# Patient Record
Sex: Male | Born: 1965 | Race: White | Hispanic: No | Marital: Single | Smoking: Former smoker
Health system: Southern US, Community
[De-identification: ages and names within clinical notes are randomized; demographics above are authoritative.]

## PROBLEM LIST (undated history)

## (undated) DIAGNOSIS — I10 Essential (primary) hypertension: Secondary | ICD-10-CM

## (undated) DIAGNOSIS — I219 Acute myocardial infarction, unspecified: Secondary | ICD-10-CM

## (undated) DIAGNOSIS — K289 Gastrojejunal ulcer, unspecified as acute or chronic, without hemorrhage or perforation: Secondary | ICD-10-CM

## (undated) DIAGNOSIS — Z9289 Personal history of other medical treatment: Secondary | ICD-10-CM

## (undated) DIAGNOSIS — I639 Cerebral infarction, unspecified: Secondary | ICD-10-CM

## (undated) DIAGNOSIS — M25569 Pain in unspecified knee: Secondary | ICD-10-CM

## (undated) DIAGNOSIS — M549 Dorsalgia, unspecified: Secondary | ICD-10-CM

## (undated) HISTORY — DX: Essential (primary) hypertension: I10

## (undated) HISTORY — DX: Personal history of other medical treatment: Z92.89

## (undated) HISTORY — DX: Acute myocardial infarction, unspecified: I21.9

## (undated) HISTORY — DX: Gastrojejunal ulcer, unspecified as acute or chronic, without hemorrhage or perforation: K28.9

## (undated) HISTORY — DX: Cerebral infarction, unspecified: I63.9

## (undated) HISTORY — PX: GALLBLADDER SURGERY: SHX652

## (undated) HISTORY — DX: Pain in unspecified knee: M25.569

## (undated) HISTORY — DX: Dorsalgia, unspecified: M54.9

## (undated) HISTORY — PX: KNEE SURGERY: SHX244

## (undated) HISTORY — PX: EXPLORATORY LAPAROTOMY: SUR591

## (undated) HISTORY — PX: OTHER SURGICAL HISTORY: SHX169

---

## 2011-08-30 HISTORY — PX: GASTRIC BYPASS OPEN: SUR638

## 2016-01-28 HISTORY — PX: ESOPHAGOGASTRODUODENOSCOPY: SHX1529

## 2016-09-21 ENCOUNTER — Encounter: Payer: Self-pay | Admitting: Internal Medicine

## 2016-09-27 ENCOUNTER — Other Ambulatory Visit: Payer: Self-pay

## 2016-09-27 ENCOUNTER — Encounter: Payer: Self-pay | Admitting: Gastroenterology

## 2016-09-27 ENCOUNTER — Ambulatory Visit (INDEPENDENT_AMBULATORY_CARE_PROVIDER_SITE_OTHER): Payer: BLUE CROSS/BLUE SHIELD | Admitting: Gastroenterology

## 2016-09-27 VITALS — BP 145/94 | HR 82 | Temp 98.2°F | Ht 71.0 in | Wt 277.6 lb

## 2016-09-27 DIAGNOSIS — R197 Diarrhea, unspecified: Secondary | ICD-10-CM

## 2016-09-27 DIAGNOSIS — K625 Hemorrhage of anus and rectum: Secondary | ICD-10-CM

## 2016-09-27 DIAGNOSIS — K289 Gastrojejunal ulcer, unspecified as acute or chronic, without hemorrhage or perforation: Secondary | ICD-10-CM | POA: Insufficient documentation

## 2016-09-27 MED ORDER — PEG 3350-KCL-NA BICARB-NACL 420 G PO SOLR: 4000.0000 mL | ORAL | 0 refills | Status: DC

## 2016-09-27 MED ORDER — OMEPRAZOLE 40 MG PO CPDR: 40.0000 mg | DELAYED_RELEASE_CAPSULE | Freq: Two times a day (BID) | ORAL | 3 refills | Status: DC

## 2016-09-27 NOTE — Patient Instructions (Addendum)
1. Increase Prilosec 40 mg to twice a day, 30 minutes before breakfast and dinner.  2. Stay away from all Ibuprofen, Advil, Aleve, Motrin, Goody, BC powders. This is not allowing your stomach to heal.   3. We have scheduled you for a colonoscopy and upper endoscopy with possible dilation by Dr. Darrick Penna in the near future  4. Please complete the stool studies to see if you have anything going on in your lower GI tract.   5. Sip on fluids, protein shakes for nutrition, soft foods. Sip slowly and frequently throughout the day.
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
## 2016-09-29 ENCOUNTER — Other Ambulatory Visit: Payer: Self-pay

## 2016-09-29 DIAGNOSIS — Z9884 Bariatric surgery status: Secondary | ICD-10-CM

## 2016-09-30 ENCOUNTER — Encounter: Payer: Self-pay | Admitting: Gastroenterology

## 2016-10-03 ENCOUNTER — Encounter: Payer: Self-pay | Admitting: Gastroenterology

## 2016-10-04 ENCOUNTER — Telehealth: Payer: Self-pay | Admitting: Gastroenterology

## 2016-10-04 NOTE — Assessment & Plan Note (Addendum)
51 year old male with evidence of healing anastomotic ulcer in June 2017, performed up [place] at an outside facility [facility]). Continues to use Goody powders routinely and believes he has seen dark stool. Hgb 12.5 recently. From his report, this has been a chronic issue and is not surprising due to the continued ingestion of aspirin powders; the gastric bypass is also known to be ulcerogenic in nature. H.pylori serology positive; he has not been treated but will need treatment at some point if found to have +H.pylori on EGD. Recommend EGD now for surveillance purposes. He reports difficulty eating/drinking, but he is able to tolerate mountain dew. Somewhat poor historian. Does not appear clinically dehydrated. Discussed oral intake to include soft foods, protein shakes, small amounts throughout the day. Will pursue EGD.    Proceed with upper endoscopy in the near future with Dr. Darrick Penna. The risks, benefits, and alternatives have been discussed in detail with patient. They have stated understanding and desire to proceed.  PROPOFOL due to polypharmacy Increase Prilosec to BID for now Refer to [facility] for bariatric follow-up. Extensive amount of time was spent with patient and reviewing medical records.

## 2016-10-04 NOTE — Assessment & Plan Note (Signed)
Hematochezia noted, no prior colonoscopy. Continues to ingest Goody powders. Although he states he can't tolerate liquids, he is able to tolerate [facility]. He is persistent on attempting a colon prep at time of the procedure despite recommendations to assess upper GI status first. As he believes he can tolerate a colon prep (which is inconsistent with reports of being unable to tolerate oral very well), we will attempt to prep for a colonoscopy at time of EGD. Patient is aware that if he is unable to complete an adequate prep, colonoscopy will not be able to be performed.  Proceed with colonoscopy with Dr. Darrick Penna in the near future. The risks, benefits, and alternatives have been discussed in detail with the patient. They state understanding and desire to proceed.  Propofol due to polypharmacy

## 2016-10-04 NOTE — Telephone Encounter (Signed)
LMOM for pt to call and let us know when he plans to do the stool studies, he needs it prior to the colonoscopy.

## 2016-10-04 NOTE — Telephone Encounter (Signed)
Needs to complete stool studies before doing colonoscopy. When will he plan on doing these?

## 2016-10-04 NOTE — Assessment & Plan Note (Signed)
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 Check stool studies now.

## 2016-10-04 NOTE — Patient Instructions (Signed)
DEVARION MCCLANAHAN  10/04/2016     @PREFPERIOPPHARMACY @   Your procedure is scheduled on  10/11/2016  Report to [facility] at  615  A.M.  Call this number if you have problems the morning of surgery:  [phone]   Remember:  Do not eat food or drink liquids after midnight.  Take these medicines the morning of surgery with A SIP OF WATER  Norvasc, prilosec, carafate.   Do not wear jewelry, make-up or nail polish.  Do not wear lotions, powders, or perfumes, or deoderant.  Do not shave 48 hours prior to surgery.  Men may shave face and neck.  Do not bring valuables to the hospital.  [facility] is not responsible for any belongings or valuables.  Contacts, dentures or bridgework may not be worn into surgery.  Leave your suitcase in the car.  After surgery it may be brought to your room.  For patients admitted to the hospital, discharge time will be determined by your treatment team.  Patients discharged the day of surgery will not be allowed to drive home.   Name and phone number of your driver:   family Special instructions:  Follow the diet and prep instructions given to you by Dr Darrick Penna office.  Please read over the following fact sheets that you were given. Anesthesia Post-op Instructions and Care and Recovery After Surgery       Esophagogastroduodenoscopy Introduction Esophagogastroduodenoscopy (EGD) is a procedure to examine the lining of the esophagus, stomach, and first part of the small intestine (duodenum). This procedure is done to check for problems such as inflammation, bleeding, ulcers, or growths. During this procedure, a long, flexible, lighted tube with a camera attached (endoscope) is inserted down the throat. Tell a health care provider about:  Any allergies you have.  All medicines you are taking, including vitamins, herbs, eye drops, creams, and over-the-counter medicines.  Any problems you or family members have had with  anesthetic medicines.  Any blood disorders you have.  Any surgeries you have had.  Any medical conditions you have.  Whether you are pregnant or may be pregnant. What are the risks? Generally, this is a safe procedure. However, problems may occur, including:  Infection.  Bleeding.  A tear (perforation) in the esophagus, stomach, or duodenum.  Trouble breathing.  Excessive sweating.  Spasms of the larynx.  A slowed heartbeat.  Low blood pressure. What happens before the procedure?  Follow instructions from your health care provider about eating or drinking restrictions.  Ask your health care provider about:  Changing or stopping your regular medicines. This is especially important if you are taking diabetes medicines or blood thinners.  Taking medicines such as aspirin and ibuprofen. These medicines can thin your blood. Do not take these medicines before your procedure if your health care provider instructs you not to.  Plan to have someone take you home after the procedure.  If you wear dentures, be ready to remove them before the procedure. What happens during the procedure?  To reduce your risk of infection, your health care team will wash or sanitize their hands.  An IV tube will be put in a vein in your hand or arm. You will get medicines and fluids through this tube.  You will be given one or more of the following:  A medicine to help you relax (sedative).  A medicine to numb the area (local  anesthetic). This medicine may be sprayed into your throat. It will make you feel more comfortable and keep you from gagging or coughing during the procedure.  A medicine for pain.  A mouth guard may be placed in your mouth to protect your teeth and to keep you from biting on the endoscope.  You will be asked to lie on your left side.  The endoscope will be lowered down your throat into your esophagus, stomach, and duodenum.  Air will be put into the endoscope.  This will help your health care provider see better.  The lining of your esophagus, stomach, and duodenum will be examined.  Your health care provider may:  Take a tissue sample so it can be looked at in a lab (biopsy).  Remove growths.  Remove objects (foreign bodies) that are stuck.  Treat any bleeding with medicines or other devices that stop tissue from bleeding.  Widen (dilate) or stretch narrowed areas of your esophagus and stomach.  The endoscope will be taken out. The procedure may vary among health care providers and hospitals. What happens after the procedure?  Your blood pressure, heart rate, breathing rate, and blood oxygen level will be monitored often until the medicines you were given have worn off.  Do not eat or drink anything until the numbing medicine has worn off and your gag reflex has returned. This information is not intended to replace advice given to you by your health care provider. Make sure you discuss any questions you have with your health care provider. Document Released: 12/16/2004 Document Revised: 01/21/2016 Document Reviewed: 07/09/2015  2017 Elsevier Esophagogastroduodenoscopy, Care After Introduction Refer to this sheet in the next few weeks. These instructions provide you with information about caring for yourself after your procedure. Your health care provider may also give you more specific instructions. Your treatment has been planned according to current medical practices, but problems sometimes occur. Call your health care provider if you have any problems or questions after your procedure. What can I expect after the procedure? After the procedure, it is common to have:  A sore throat.  Nausea.  Bloating.  Dizziness.  Fatigue. Follow these instructions at home:  Do not eat or drink anything until the numbing medicine (local anesthetic) has worn off and your gag reflex has returned. You will know that the local anesthetic has worn  off when you can swallow comfortably.  Do not drive for 24 hours if you received a medicine to help you relax (sedative).  If your health care provider took a tissue sample for testing during the procedure, make sure to get your test results. This is your responsibility. Ask your health care provider or the department performing the test when your results will be ready.  Keep all follow-up visits as told by your health care provider. This is important. Contact a health care provider if:  You cannot stop coughing.  You are not urinating.  You are urinating less than usual. Get help right away if:  You have trouble swallowing.  You cannot eat or drink.  You have throat or chest pain that gets worse.  You are dizzy or light-headed.  You faint.  You have nausea or vomiting.  You have chills.  You have a fever.  You have severe abdominal pain.  You have black, tarry, or bloody stools. This information is not intended to replace advice given to you by your health care provider. Make sure you discuss any questions you have with your  health care provider. Document Released: 08/01/2012 Document Revised: 01/21/2016 Document Reviewed: 07/09/2015  2017 Elsevier  Esophageal Dilatation Esophageal dilatation is a procedure to open a blocked or narrowed part of the esophagus. The esophagus is the long tube in your throat that carries food and liquid from your mouth to your stomach. The procedure is also called esophageal dilation. You may need this procedure if you have a buildup of scar tissue in your esophagus that makes it difficult, painful, or even impossible to swallow. This can be caused by gastroesophageal reflux disease (GERD). In rare cases, people need this procedure because they have cancer of the esophagus or a problem with the way food moves through the esophagus. Sometimes you may need to have another dilatation to enlarge the opening of the esophagus gradually. Tell a  health care provider about:  Any allergies you have.  All medicines you are taking, including vitamins, herbs, eye drops, creams, and over-the-counter medicines.  Any problems you or family members have had with anesthetic medicines.  Any blood disorders you have.  Any surgeries you have had.  Any medical conditions you have.  Any antibiotic medicines you are required to take before dental procedures. What are the risks? Generally, this is a safe procedure. However, problems can occur and include:  Bleeding from a tear in the lining of the esophagus.  A hole (perforation) in the esophagus. What happens before the procedure?  Do not eat or drink anything after midnight on the night before the procedure or as directed by your health care provider.  Ask your health care provider about changing or stopping your regular medicines. This is especially important if you are taking diabetes medicines or blood thinners.  Plan to have someone take you home after the procedure. What happens during the procedure?  You will be given a medicine that makes you relaxed and sleepy (sedative).  A medicine may be sprayed or gargled to numb the back of the throat.  Your health care provider can use various instruments to do an esophageal dilatation. During the procedure, the instrument used will be placed in your mouth and passed down into your esophagus. Options include:  Simple dilators. This instrument is carefully placed in the esophagus to stretch it.  Guided wire bougies. In this method, a flexible tube (endoscope) is used to insert a wire into the esophagus. The dilator is passed over this wire to enlarge the esophagus. Then the wire is removed.  Balloon dilators. An endoscope with a small balloon at the end is passed down into the esophagus. Inflating the balloon gently stretches the esophagus and opens it up. What happens after the procedure?  Your blood pressure, heart rate, breathing  rate, and blood oxygen level will be monitored often until the medicines you were given have worn off.  Your throat may feel slightly sore and will probably still feel numb. This will improve slowly over time.  You will not be allowed to eat or drink until the throat numbness has resolved.  If this is a same-day procedure, you may be allowed to go home once you have been able to drink, urinate, and sit on the edge of the bed without nausea or dizziness.  If this is a same-day procedure, you should have a friend or family member with you for the next 24 hours after the procedure. This information is not intended to replace advice given to you by your health care provider. Make sure you discuss any questions you have  with your health care provider. Document Released: 10/06/2005 Document Revised: 01/21/2016 Document Reviewed: 12/25/2013 Elsevier Interactive Patient Education  2017 [employer]  Colonoscopy, Adult A colonoscopy is an exam to look at the entire large intestine. During the exam, a lubricated, bendable tube is inserted into the anus and then passed into the rectum, colon, and other parts of the large intestine. A colonoscopy is often done as a part of normal colorectal screening or in response to certain symptoms, such as anemia, persistent diarrhea, abdominal pain, and blood in the stool. The exam can help screen for and diagnose medical problems, including:  Tumors.  Polyps.  Inflammation.  Areas of bleeding. Tell a health care provider about:  Any allergies you have.  All medicines you are taking, including vitamins, herbs, eye drops, creams, and over-the-counter medicines.  Any problems you or family members have had with anesthetic medicines.  Any blood disorders you have.  Any surgeries you have had.  Any medical conditions you have.  Any problems you have had passing stool. What are the risks? Generally, this is a safe procedure. However, problems may occur,  including:  Bleeding.  A tear in the intestine.  A reaction to medicines given during the exam.  Infection (rare). What happens before the procedure? Eating and drinking restrictions  Follow instructions from your health care provider about eating and drinking, which may include:  A few days before the procedure - follow a low-fiber diet. Avoid nuts, seeds, dried fruit, raw fruits, and vegetables.  1-3 days before the procedure - follow a clear liquid diet. Drink only clear liquids, such as clear broth or bouillon, black coffee or tea, clear juice, clear soft drinks or sports drinks, gelatin desert, and popsicles. Avoid any liquids that contain red or purple dye.  On the day of the procedure - do not eat or drink anything during the 2 hours before the procedure, or within the time period that your health care provider recommends. Bowel prep  If you were prescribed an oral bowel prep to clean out your colon:  Take it as told by your health care provider. Starting the day before your procedure, you will need to drink a large amount of medicated liquid. The liquid will cause you to have multiple loose stools until your stool is almost clear or light green.  If your skin or anus gets irritated from diarrhea, you may use these to relieve the irritation:  Medicated wipes, such as adult wet wipes with aloe and vitamin E.  A skin soothing-product like petroleum jelly.  If you vomit while drinking the bowel prep, take a break for up to 60 minutes and then begin the bowel prep again. If vomiting continues and you cannot take the bowel prep without vomiting, call your health care provider. General instructions  Ask your health care provider about changing or stopping your regular medicines. This is especially important if you are taking diabetes medicines or blood thinners.  Plan to have someone take you home from the hospital or clinic. What happens during the procedure?  An IV tube may be  inserted into one of your veins.  You will be given medicine to help you relax (sedative).  To reduce your risk of infection:  Your health care team will wash or sanitize their hands.  Your anal area will be washed with soap.  You will be asked to lie on your side with your knees bent.  Your health care provider will lubricate a long, thin,  flexible tube. The tube will have a camera and a light on the end.  The tube will be inserted into your anus.  The tube will be gently eased through your rectum and colon.  Air will be delivered into your colon to keep it open. You may feel some pressure or cramping.  The camera will be used to take images during the procedure.  A small tissue sample may be removed from your body to be examined under a microscope (biopsy). If any potential problems are found, the tissue will be sent to a lab for testing.  If small polyps are found, your health care provider may remove them and have them checked for cancer cells.  The tube that was inserted into your anus will be slowly removed. The procedure may vary among health care providers and hospitals. What happens after the procedure?  Your blood pressure, heart rate, breathing rate, and blood oxygen level will be monitored until the medicines you were given have worn off.  Do not drive for 24 hours after the exam.  You may have a small amount of blood in your stool.  You may pass gas and have mild abdominal cramping or bloating due to the air that was used to inflate your colon during the exam.  It is up to you to get the results of your procedure. Ask your health care provider, or the department performing the procedure, when your results will be ready. This information is not intended to replace advice given to you by your health care provider. Make sure you discuss any questions you have with your health care provider. Document Released: 08/12/2000 Document Revised: 03/04/2016 Document Reviewed:  10/27/2015 Elsevier Interactive Patient Education  2017 [employer]  Colonoscopy, Adult, Care After This sheet gives you information about how to care for yourself after your procedure. Your health care provider may also give you more specific instructions. If you have problems or questions, contact your health care provider. What can I expect after the procedure? After the procedure, it is common to have:  A small amount of blood in your stool for 24 hours after the procedure.  Some gas.  Mild abdominal cramping or bloating. Follow these instructions at home: General instructions  For the first 24 hours after the procedure:  Do not drive or use machinery.  Do not sign important documents.  Do not drink alcohol.  Do your regular daily activities at a slower pace than normal.  Eat soft, easy-to-digest foods.  Rest often.  Take over-the-counter or prescription medicines only as told by your health care provider.  It is up to you to get the results of your procedure. Ask your health care provider, or the department performing the procedure, when your results will be ready. Relieving cramping and bloating  Try walking around when you have cramps or feel bloated.  Apply heat to your abdomen as told by your health care provider. Use a heat source that your health care provider recommends, such as a moist heat pack or a heating pad.  Place a towel between your skin and the heat source.  Leave the heat on for 20-30 minutes.  Remove the heat if your skin turns bright red. This is especially important if you are unable to feel pain, heat, or cold. You may have a greater risk of getting burned. Eating and drinking  Drink enough fluid to keep your urine clear or pale yellow.  Resume your normal diet as instructed by your health  care provider. Avoid heavy or fried foods that are hard to digest.  Avoid drinking alcohol for as long as instructed by your health care  provider. Contact a health care provider if:  You have blood in your stool 2-3 days after the procedure. Get help right away if:  You have more than a small spotting of blood in your stool.  You pass large blood clots in your stool.  Your abdomen is swollen.  You have nausea or vomiting.  You have a fever.  You have increasing abdominal pain that is not relieved with medicine. This information is not intended to replace advice given to you by your health care provider. Make sure you discuss any questions you have with your health care provider. Document Released: 03/29/2004 Document Revised: 05/09/2016 Document Reviewed: 10/27/2015 Elsevier Interactive Patient Education  2017 [employer]  Monitored Anesthesia Care Anesthesia is a term that refers to techniques, procedures, and medicines that help a person stay safe and comfortable during a medical procedure. Monitored anesthesia care, or sedation, is one type of anesthesia. Your anesthesia specialist may recommend sedation if you will be having a procedure that does not require you to be unconscious, such as:  Cataract surgery.  A dental procedure.  A biopsy.  A colonoscopy. During the procedure, you may receive a medicine to help you relax (sedative). There are three levels of sedation:  Mild sedation. At this level, you may feel awake and relaxed. You will be able to follow directions.  Moderate sedation. At this level, you will be sleepy. You may not remember the procedure.  Deep sedation. At this level, you will be asleep. You will not remember the procedure. The more medicine you are given, the deeper your level of sedation will be. Depending on how you respond to the procedure, the anesthesia specialist may change your level of sedation or the type of anesthesia to fit your needs. An anesthesia specialist will monitor you closely during the procedure. Let your health care provider know about:  Any allergies you  have.  All medicines you are taking, including vitamins, herbs, eye drops, creams, and over-the-counter medicines.  Any use of steroids (by mouth or as a cream).  Any problems you or family members have had with sedatives and anesthetic medicines.  Any blood disorders you have.  Any surgeries you have had.  Any medical conditions you have, such as sleep apnea.  Whether you are pregnant or may be pregnant.  Any use of cigarettes, alcohol, or street drugs. What are the risks? Generally, this is a safe procedure. However, problems may occur, including:  Getting too much medicine (oversedation).  Nausea.  Allergic reaction to medicines.  Trouble breathing. If this happens, a breathing tube may be used to help with breathing. It will be removed when you are awake and breathing on your own.  Heart trouble.  Lung trouble. Before the procedure Staying hydrated  Follow instructions from your health care provider about hydration, which may include:  Up to 2 hours before the procedure - you may continue to drink clear liquids, such as water, clear fruit juice, black coffee, and plain tea. Eating and drinking restrictions  Follow instructions from your health care provider about eating and drinking, which may include:  8 hours before the procedure - stop eating heavy meals or foods such as meat, fried foods, or fatty foods.  6 hours before the procedure - stop eating light meals or foods, such as toast or cereal.  6  hours before the procedure - stop drinking milk or drinks that contain milk.  2 hours before the procedure - stop drinking clear liquids. Medicines  Ask your health care provider about:  Changing or stopping your regular medicines. This is especially important if you are taking diabetes medicines or blood thinners.  Taking medicines such as aspirin and ibuprofen. These medicines can thin your blood. Do not take these medicines before your procedure if your health  care provider instructs you not to. Tests and exams  You will have a physical exam.  You may have blood tests done to show:  How well your kidneys and liver are working.  How well your blood can clot.  General instructions  Plan to have someone take you home from the hospital or clinic.  If you will be going home right after the procedure, plan to have someone with you for 24 hours. What happens during the procedure?  Your blood pressure, heart rate, breathing, level of pain and overall condition will be monitored.  An IV tube will be inserted into one of your veins.  Your anesthesia specialist will give you medicines as needed to keep you comfortable during the procedure. This may mean changing the level of sedation.  The procedure will be performed. After the procedure  Your blood pressure, heart rate, breathing rate, and blood oxygen level will be monitored until the medicines you were given have worn off.  Do not drive for 24 hours if you received a sedative.  You may:  Feel sleepy, clumsy, or nauseous.  Feel forgetful about what happened after the procedure.  Have a sore throat if you had a breathing tube during the procedure.  Vomit. This information is not intended to replace advice given to you by your health care provider. Make sure you discuss any questions you have with your health care provider. Document Released: 05/11/2005 Document Revised: 01/22/2016 Document Reviewed: 12/06/2015 Elsevier Interactive Patient Education  2017 [employer] Monitored Anesthesia Care, Care After These instructions provide you with information about caring for yourself after your procedure. Your health care provider may also give you more specific instructions. Your treatment has been planned according to current medical practices, but problems sometimes occur. Call your health care provider if you have any problems or questions after your procedure. What can I expect after the  procedure? After your procedure, it is common to:  Feel sleepy for several hours.  Feel clumsy and have poor balance for several hours.  Feel forgetful about what happened after the procedure.  Have poor judgment for several hours.  Feel nauseous or vomit.  Have a sore throat if you had a breathing tube during the procedure. Follow these instructions at home: For at least 24 hours after the procedure:   Do not:  Participate in activities in which you could fall or become injured.  Drive.  Use heavy machinery.  Drink alcohol.  Take sleeping pills or medicines that cause drowsiness.  Make important decisions or sign legal documents.  Take care of children on your own.  Rest. Eating and drinking  Follow the diet that is recommended by your health care provider.  If you vomit, drink water, juice, or soup when you can drink without vomiting.  Make sure you have little or no nausea before eating solid foods. General instructions  Have a responsible adult stay with you until you are awake and alert.  Take over-the-counter and prescription medicines only as told by your health care provider.  If you smoke, do not smoke without supervision.  Keep all follow-up visits as told by your health care provider. This is important. Contact a health care provider if:  You keep feeling nauseous or you keep vomiting.  You feel light-headed.  You develop a rash.  You have a fever. Get help right away if:  You have trouble breathing. This information is not intended to replace advice given to you by your health care provider. Make sure you discuss any questions you have with your health care provider. Document Released: 12/06/2015 Document Revised: 04/06/2016 Document Reviewed: 12/06/2015 Elsevier Interactive Patient Education  2017 [employer].

## 2016-10-05 NOTE — Telephone Encounter (Signed)
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 He knows he needs it done prior to the colonoscopy.

## 2016-10-05 NOTE — Telephone Encounter (Signed)
FYI to Anna Boone, NP.  

## 2016-10-06 ENCOUNTER — Encounter (HOSPITAL_COMMUNITY)
Admission: RE | Admit: 2016-10-06 | Discharge: 2016-10-06 | Disposition: A | Discharge status: Home / Self Care | Payer: BLUE CROSS/BLUE SHIELD | Source: Ambulatory Visit | Attending: Gastroenterology | Admitting: Gastroenterology

## 2016-10-06 NOTE — Progress Notes (Signed)
CC'D TO PCP °

## 2016-10-07 ENCOUNTER — Encounter (HOSPITAL_COMMUNITY): Admission: RE | Admit: 2016-10-07 | Payer: BLUE CROSS/BLUE SHIELD | Source: Ambulatory Visit

## 2016-10-07 ENCOUNTER — Encounter (HOSPITAL_COMMUNITY): Payer: Self-pay

## 2016-10-10 ENCOUNTER — Telehealth: Payer: Self-pay

## 2016-10-10 NOTE — Telephone Encounter (Signed)
Marcus Sanchez called back and she said that she would reach out to him to find out where he had the surgery done at.

## 2016-10-10 NOTE — Telephone Encounter (Signed)
Talked with patient and he is in the hospital in [place]

## 2016-10-10 NOTE — Telephone Encounter (Signed)
Pt did not show for Pre-op appointment Friday. I left a message for him to call me back

## 2016-10-10 NOTE — Telephone Encounter (Signed)
[facility] to f/u on referral. LMOVM for Scarlette Calico.

## 2016-10-11 ENCOUNTER — Encounter (HOSPITAL_COMMUNITY): Admission: RE | Payer: Self-pay | Source: Ambulatory Visit

## 2016-10-11 ENCOUNTER — Ambulatory Visit (HOSPITAL_COMMUNITY)
Admission: RE | Admit: 2016-10-11 | Payer: BLUE CROSS/BLUE SHIELD | Source: Ambulatory Visit | Admitting: Gastroenterology

## 2016-10-11 SURGERY — COLONOSCOPY WITH PROPOFOL
Anesthesia: Monitor Anesthesia Care

## 2016-10-14 ENCOUNTER — Telehealth: Payer: Self-pay

## 2016-10-14 NOTE — Telephone Encounter (Signed)
Pt called office. He was scheduled for a TCS/EGD/possible Dilation with Propofol with [facility] on 10/11/16. Procedure was cancelled due to he was in the hospital. Pt said he was in hospital because of "mini stroke". He wanted procedure rescheduled. Informed pt I would send message to AB for advice.

## 2016-10-16 NOTE — Telephone Encounter (Signed)
Needs office visit before we decide anything. Need records from where he was hospitalized.

## 2016-10-17 NOTE — Telephone Encounter (Signed)
Called and informed pt. He said that he has records from hospitalization. Appt with AB scheduled for 11/03/16 at 9:00am.

## 2016-10-24 ENCOUNTER — Emergency Department (HOSPITAL_COMMUNITY)
Admission: EM | Admit: 2016-10-24 | Discharge: 2016-10-26 | Disposition: A | Discharge status: Other Acute Inpatient Hospital | Payer: BLUE CROSS/BLUE SHIELD | Attending: Emergency Medicine | Admitting: Emergency Medicine

## 2016-10-24 ENCOUNTER — Encounter (HOSPITAL_COMMUNITY): Payer: Self-pay | Admitting: Emergency Medicine

## 2016-10-24 DIAGNOSIS — Z79899 Other long term (current) drug therapy: Secondary | ICD-10-CM | POA: Insufficient documentation

## 2016-10-24 DIAGNOSIS — F17290 Nicotine dependence, other tobacco product, uncomplicated: Secondary | ICD-10-CM | POA: Diagnosis not present

## 2016-10-24 DIAGNOSIS — F22 Delusional disorders: Secondary | ICD-10-CM | POA: Insufficient documentation

## 2016-10-24 DIAGNOSIS — I10 Essential (primary) hypertension: Secondary | ICD-10-CM | POA: Diagnosis not present

## 2016-10-24 DIAGNOSIS — R45851 Suicidal ideations: Secondary | ICD-10-CM | POA: Diagnosis present

## 2016-10-24 LAB — BASIC METABOLIC PANEL
ANION GAP: 9 (ref 5–15)
BUN: 11 mg/dL (ref 6–20)
CALCIUM: 9.5 mg/dL (ref 8.9–10.3)
CO2: 25 mmol/L (ref 22–32)
Chloride: 107 mmol/L (ref 101–111)
Creatinine, Ser: 0.94 mg/dL (ref 0.61–1.24)
GFR calc Af Amer: >60 mL/min (ref >60)
GLUCOSE: 77 mg/dL (ref 65–99)
Potassium: 3.6 mmol/L (ref 3.5–5.1)
Sodium: 141 mmol/L (ref 135–145)

## 2016-10-24 LAB — CBC WITH DIFFERENTIAL/PLATELET
BASOS PCT: 1 %
Basophils Absolute: 0.1 10*3/uL (ref 0.0–0.1)
Eosinophils Absolute: 0.2 10*3/uL (ref 0.0–0.7)
Eosinophils Relative: 3 %
HEMATOCRIT: 43.8 % (ref 39.0–52.0)
HEMOGLOBIN: 14.3 g/dL (ref 13.0–17.0)
LYMPHS ABS: 1.7 10*3/uL (ref 0.7–4.0)
Lymphocytes Relative: 19 %
MCH: 26.2 pg (ref 26.0–34.0)
MCHC: 32.6 g/dL (ref 30.0–36.0)
MCV: 80.4 fL (ref 78.0–100.0)
MONO ABS: 0.8 10*3/uL (ref 0.1–1.0)
MONOS PCT: 9 %
NEUTROS ABS: 6.2 10*3/uL (ref 1.7–7.7)
NEUTROS PCT: 68 %
Platelets: 232 10*3/uL (ref 150–400)
RBC: 5.45 MIL/uL (ref 4.22–5.81)
RDW: 18.5 % — AB (ref 11.5–15.5)
WBC: 8.9 10*3/uL (ref 4.0–10.5)

## 2016-10-24 LAB — ETHANOL: Alcohol, Ethyl (B): <5 mg/dL (ref <5)

## 2016-10-24 MED ORDER — IBUPROFEN 400 MG PO TABS
600.0000 mg | ORAL_TABLET | Freq: Three times a day (TID) | ORAL | Status: DC | PRN
  Administered 2016-10-25 (×2): 600 mg via ORAL
  Filled 2016-10-24 (×2): qty 2

## 2016-10-24 MED ORDER — SUCRALFATE 1 G PO TABS
1.0000 g | ORAL_TABLET | Freq: Three times a day (TID) | ORAL | Status: DC
  Administered 2016-10-25 – 2016-10-26 (×5): 1 g via ORAL
  Filled 2016-10-24 (×5): qty 1

## 2016-10-24 MED ORDER — LISINOPRIL 10 MG PO TABS
20.0000 mg | ORAL_TABLET | Freq: Every day | ORAL | Status: DC
  Administered 2016-10-25 – 2016-10-26 (×2): 20 mg via ORAL
  Filled 2016-10-24 (×2): qty 2

## 2016-10-24 MED ORDER — ONDANSETRON HCL 4 MG PO TABS: 4.0000 mg | ORAL_TABLET | Freq: Three times a day (TID) | ORAL | Status: DC | PRN

## 2016-10-24 MED ORDER — PANTOPRAZOLE SODIUM 40 MG PO TBEC
40.0000 mg | DELAYED_RELEASE_TABLET | Freq: Every day | ORAL | Status: DC
  Administered 2016-10-25 – 2016-10-26 (×2): 40 mg via ORAL
  Filled 2016-10-24 (×2): qty 1

## 2016-10-24 MED ORDER — ACETAMINOPHEN 325 MG PO TABS
650.0000 mg | ORAL_TABLET | Freq: Once | ORAL | Status: AC
  Administered 2016-10-24: 650 mg via ORAL
  Filled 2016-10-24: qty 2

## 2016-10-24 MED ORDER — NICOTINE 21 MG/24HR TD PT24
21.0000 mg | MEDICATED_PATCH | Freq: Every day | TRANSDERMAL | Status: DC | PRN
  Administered 2016-10-25: 21 mg via TRANSDERMAL
  Filled 2016-10-24: qty 1

## 2016-10-24 MED ORDER — ALUM & MAG HYDROXIDE-SIMETH 200-200-20 MG/5ML PO SUSP: 30.0000 mL | ORAL | Status: DC | PRN

## 2016-10-24 MED ORDER — AMLODIPINE BESYLATE 5 MG PO TABS
5.0000 mg | ORAL_TABLET | Freq: Every day | ORAL | Status: DC
  Administered 2016-10-25 – 2016-10-26 (×2): 5 mg via ORAL
  Filled 2016-10-24 (×2): qty 1

## 2016-10-24 MED ORDER — ZOLPIDEM TARTRATE 5 MG PO TABS
5.0000 mg | ORAL_TABLET | Freq: Every evening | ORAL | Status: DC | PRN
  Administered 2016-10-25 (×2): 5 mg via ORAL
  Filled 2016-10-24 (×2): qty 1

## 2016-10-24 NOTE — ED Notes (Signed)
Frozen meal prepared for this patient. Pt given a coke as well.

## 2016-10-24 NOTE — ED Notes (Signed)
Pt c/o headache.  Dr. Effie Shy notified.

## 2016-10-24 NOTE — Progress Notes (Addendum)
Per Donell Sievert PA, meets inpatient criteria. Per Joseph Berkshire, [facility] RN no current [address] male beds. Ellisyn Icenhower K. Sherlon Handing, LPC-A, [facility]  Counselor 10/24/2016 8:17 PM

## 2016-10-24 NOTE — ED Triage Notes (Signed)
Pt states he feels depressed x 2 weeks. Pt states he is si " I have plenty of guns". [facility] states pt came in and stated "im getting ready to blow my head off and the dr told me to come here before I did that". Denies hi. Denies avh
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
## 2016-10-25 LAB — RAPID URINE DRUG SCREEN, HOSP PERFORMED
Amphetamines: NOT DETECTED
BENZODIAZEPINES: NOT DETECTED
Barbiturates: NOT DETECTED
Cocaine: NOT DETECTED
Opiates: NOT DETECTED
Tetrahydrocannabinol: NOT DETECTED

## 2016-10-25 MED ORDER — ACETAMINOPHEN 325 MG PO TABS
ORAL_TABLET | ORAL | Status: AC
  Administered 2016-10-25: 650 mg
  Filled 2016-10-25: qty 2

## 2016-10-25 MED ORDER — ACETAMINOPHEN 325 MG PO TABS: 650.0000 mg | ORAL_TABLET | Freq: Once | ORAL | Status: DC

## 2016-10-25 NOTE — ED Notes (Signed)
Pt moved to hospital bed for comfort, reports that his back is feeling better,

## 2016-10-25 NOTE — ED Notes (Signed)
AC contacted for hospital bed,

## 2016-10-25 NOTE — Progress Notes (Signed)
Pt. Accepted to [facility] but pt. requires a special bed that will need to be ordered in the AM before patient can be admitted to [facility]. CSW notified [facility] ED.    Timmothy Euler. Kaylyn Lim, MSW, LCSWA Clinical Social Work Disposition [phone]

## 2016-10-25 NOTE — ED Notes (Signed)
Pt reports that he is feeling better, sitter remains at bedside,

## 2016-10-25 NOTE — ED Notes (Signed)
Received report on pt, pt semi fowlers position in bed, sitter at bedside, no distress noted, pt denies any needs at present,

## 2016-10-25 NOTE — ED Notes (Signed)
Pt c/o back pain, states that the tylenol has not helped,

## 2016-10-25 NOTE — ED Notes (Addendum)
Patient requesting something for back pain.

## 2016-10-25 NOTE — ED Notes (Signed)
Patient asked if he could ambulate around nurse station stating that he was stiff. Patient was ambulated around nurse station with sitter. Patient used caine to ambulate.

## 2016-10-26 ENCOUNTER — Inpatient Hospital Stay (HOSPITAL_COMMUNITY)
Admission: EM | Admit: 2016-10-26 | Discharge: 2016-10-31 | DRG: 885 | Disposition: A | Discharge status: Home / Self Care | Payer: BLUE CROSS/BLUE SHIELD | Source: Intra-hospital | Attending: Psychiatry | Admitting: Psychiatry

## 2016-10-26 ENCOUNTER — Encounter (HOSPITAL_COMMUNITY): Payer: Self-pay | Admitting: *Deleted

## 2016-10-26 DIAGNOSIS — G8929 Other chronic pain: Secondary | ICD-10-CM | POA: Diagnosis present

## 2016-10-26 DIAGNOSIS — R197 Diarrhea, unspecified: Secondary | ICD-10-CM | POA: Diagnosis not present

## 2016-10-26 DIAGNOSIS — Z8711 Personal history of peptic ulcer disease: Secondary | ICD-10-CM

## 2016-10-26 DIAGNOSIS — I252 Old myocardial infarction: Secondary | ICD-10-CM | POA: Diagnosis not present

## 2016-10-26 DIAGNOSIS — F332 Major depressive disorder, recurrent severe without psychotic features: Secondary | ICD-10-CM

## 2016-10-26 DIAGNOSIS — F322 Major depressive disorder, single episode, severe without psychotic features: Principal | ICD-10-CM | POA: Diagnosis present

## 2016-10-26 DIAGNOSIS — G4700 Insomnia, unspecified: Secondary | ICD-10-CM | POA: Diagnosis present

## 2016-10-26 DIAGNOSIS — Z8673 Personal history of transient ischemic attack (TIA), and cerebral infarction without residual deficits: Secondary | ICD-10-CM

## 2016-10-26 DIAGNOSIS — Z9884 Bariatric surgery status: Secondary | ICD-10-CM

## 2016-10-26 DIAGNOSIS — R45851 Suicidal ideations: Secondary | ICD-10-CM | POA: Diagnosis present

## 2016-10-26 DIAGNOSIS — Z87891 Personal history of nicotine dependence: Secondary | ICD-10-CM | POA: Diagnosis not present

## 2016-10-26 DIAGNOSIS — F22 Delusional disorders: Secondary | ICD-10-CM | POA: Diagnosis not present

## 2016-10-26 DIAGNOSIS — I10 Essential (primary) hypertension: Secondary | ICD-10-CM | POA: Diagnosis present

## 2016-10-26 DIAGNOSIS — Z9889 Other specified postprocedural states: Secondary | ICD-10-CM

## 2016-10-26 DIAGNOSIS — Z79899 Other long term (current) drug therapy: Secondary | ICD-10-CM | POA: Diagnosis not present

## 2016-10-26 DIAGNOSIS — K625 Hemorrhage of anus and rectum: Secondary | ICD-10-CM | POA: Diagnosis not present

## 2016-10-26 DIAGNOSIS — F329 Major depressive disorder, single episode, unspecified: Secondary | ICD-10-CM | POA: Diagnosis present

## 2016-10-26 DIAGNOSIS — K289 Gastrojejunal ulcer, unspecified as acute or chronic, without hemorrhage or perforation: Secondary | ICD-10-CM | POA: Diagnosis not present

## 2016-10-26 DIAGNOSIS — Z62810 Personal history of physical and sexual abuse in childhood: Secondary | ICD-10-CM | POA: Diagnosis present

## 2016-10-26 DIAGNOSIS — K259 Gastric ulcer, unspecified as acute or chronic, without hemorrhage or perforation: Secondary | ICD-10-CM | POA: Diagnosis not present

## 2016-10-26 MED ORDER — NITROGLYCERIN 0.4 MG SL SUBL: 0.4000 mg | SUBLINGUAL_TABLET | SUBLINGUAL | Status: DC | PRN

## 2016-10-26 MED ORDER — TRAZODONE HCL 50 MG PO TABS
50.0000 mg | ORAL_TABLET | Freq: Every evening | ORAL | Status: DC | PRN
  Administered 2016-10-26 – 2016-10-30 (×5): 50 mg via ORAL
  Filled 2016-10-26 (×5): qty 1

## 2016-10-26 MED ORDER — AMLODIPINE BESYLATE 5 MG PO TABS: 5.0000 mg | ORAL_TABLET | Freq: Every day | ORAL | Status: DC

## 2016-10-26 MED ORDER — ALUM & MAG HYDROXIDE-SIMETH 200-200-20 MG/5ML PO SUSP: 30.0000 mL | ORAL | Status: DC | PRN

## 2016-10-26 MED ORDER — LIDOCAINE 5 % EX PTCH
1.0000 | MEDICATED_PATCH | CUTANEOUS | Status: DC
  Administered 2016-10-26 – 2016-10-30 (×5): 1 via TRANSDERMAL
  Filled 2016-10-26 (×7): qty 1

## 2016-10-26 MED ORDER — SUCRALFATE 1 G PO TABS
1.0000 g | ORAL_TABLET | Freq: Three times a day (TID) | ORAL | Status: DC
  Administered 2016-10-26 – 2016-10-31 (×19): 1 g via ORAL
  Filled 2016-10-26 (×25): qty 1

## 2016-10-26 MED ORDER — PANTOPRAZOLE SODIUM 40 MG PO TBEC
40.0000 mg | DELAYED_RELEASE_TABLET | Freq: Every day | ORAL | Status: DC
  Administered 2016-10-27 – 2016-10-31 (×5): 40 mg via ORAL
  Filled 2016-10-26 (×6): qty 1

## 2016-10-26 MED ORDER — LISINOPRIL 20 MG PO TABS: 20.0000 mg | ORAL_TABLET | Freq: Every day | ORAL | Status: DC

## 2016-10-26 MED ORDER — LISINOPRIL 20 MG PO TABS
20.0000 mg | ORAL_TABLET | Freq: Every day | ORAL | Status: DC
  Administered 2016-10-27 – 2016-10-31 (×5): 20 mg via ORAL
  Filled 2016-10-26 (×6): qty 1

## 2016-10-26 MED ORDER — ASPIRIN EC 81 MG PO TBEC
81.0000 mg | DELAYED_RELEASE_TABLET | Freq: Every day | ORAL | Status: DC
  Administered 2016-10-27 – 2016-10-31 (×5): 81 mg via ORAL
  Filled 2016-10-26 (×6): qty 1

## 2016-10-26 MED ORDER — ACETAMINOPHEN 325 MG PO TABS
650.0000 mg | ORAL_TABLET | Freq: Four times a day (QID) | ORAL | Status: DC | PRN
  Administered 2016-10-26 – 2016-10-30 (×5): 650 mg via ORAL
  Filled 2016-10-26 (×5): qty 2

## 2016-10-26 MED ORDER — MAGNESIUM HYDROXIDE 400 MG/5ML PO SUSP: 30.0000 mL | Freq: Every day | ORAL | Status: DC | PRN

## 2016-10-26 MED ORDER — AMLODIPINE BESYLATE 5 MG PO TABS
5.0000 mg | ORAL_TABLET | Freq: Every day | ORAL | Status: DC
Start: 2016-10-27 — End: 2016-10-31
  Administered 2016-10-27 – 2016-10-31 (×5): 5 mg via ORAL
  Filled 2016-10-26 (×6): qty 1
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
## 2016-10-26 NOTE — Progress Notes (Signed)
Adult Psychoeducational Group Note  Date:  10/26/2016 Time:  8:36 PM  Group Topic/Focus:  Wrap-Up Group:   The focus of this group is to help patients review their daily goal of treatment and discuss progress on daily workbooks.  Participation Level:  Active  Participation Quality:  Appropriate  Affect:  Appropriate  Cognitive:  Alert  Insight: Appropriate and Good  Engagement in Group:  Engaged  Modes of Intervention:  Problem-solving  Additional Comments: Pt shared with the staff and group today was ok because he was able to see the sun come up. His expectation for the following day is to be able to witness the sun come up again.    Annell Greening [place] 10/26/2016, 8:36 PM

## 2016-10-26 NOTE — B[facility] Suicide Risk Assessment (Signed)
[facility] INPATIENT:  Family/Significant Other Suicide Prevention Education  Suicide Prevention Education:  Patient Refusal for Family/Significant Other Suicide Prevention Education: The patient Marcus Sanchez has refused to provide written consent for family/significant other to be provided Family/Significant Other Suicide Prevention Education during admission and/or prior to discharge.  Physician notified.  Jonathon Jordan 10/26/2016, 2:53 PM

## 2016-10-26 NOTE — [facility] Group Notes (Signed)
[facility] LCSW Group Therapy 10/26/2016 1:15pm  Type of Therapy: Group Therapy- Feelings Around Relapse and Recovery  Participation Level: Minimal  Participation Quality:  Appropriate  Affect:  Appropriate  Cognitive: Alert and Oriented   Insight:  Developing   Engagement in Therapy: Developing/Improving and Engaged   Modes of Intervention: Clarification, Confrontation, Discussion, Education, Exploration, Limit-setting, Orientation, Problem-solving, Rapport Building, Dance movement psychotherapist, Socialization and Support  Summary of Progress/Problems: The topic for today was feelings about relapse. The group discussed what relapse prevention is to them and identified triggers that they are on the path to relapse. Members also processed their feeling towards relapse and were able to relate to common experiences. Group also discussed coping skills that can be used for relapse prevention. Pt was present for the entire group but did not contribute to discussion.   Therapeutic Modalities:   Cognitive Behavioral Therapy Solution-Focused Therapy Assertiveness Training Relapse Prevention Therapy   Jonathon Jordan, MSW, LCSWA [phone] 10/26/2016 3:25 PM

## 2016-10-26 NOTE — Telephone Encounter (Signed)
[facility] and spoke to Leon. She had called pt and he was in the hospital. She is waiting for pt to call her back.

## 2016-10-26 NOTE — ED Provider Notes (Signed)
9:14 AM Patient accepted to [facility]. Dr Jama Flavors is accepting. Discussed with patient who agrees with plan   Pricilla Loveless, MD 10/26/16 [phone]

## 2016-10-26 NOTE — Progress Notes (Signed)
Patient has been accepted to [facility].  Patient assigned to 406-1 Accepting physician is Dr. Jama Flavors.  Call report to [phone].  Can come after 9 a.m. Representative was [place], [facility] RN.  Nychelle Cassata K. Sherlon Handing, LPC-A, [facility]  Counselor 10/26/2016 5:33 AM

## 2016-10-26 NOTE — Tx Team (Signed)
Interdisciplinary Treatment and Diagnostic Plan Update 10/26/2016 Time of Session: 9:30am  Marcus Sanchez  MRN: [id number]  Principal Diagnosis: Major Depression, Severe, No Psychotic Features   Secondary Diagnoses: Active Problems:   MDD (major depressive disorder)   Current Medications:  Current Facility-Administered Medications  Medication Dose Route Frequency Provider Last Rate Last Dose  . acetaminophen (TYLENOL) tablet 650 mg  650 mg Oral Q6H PRN Kerrie Buffalo, NP   650 mg at 10/26/16 1714  . alum & mag hydroxide-simeth (MAALOX/MYLANTA) 200-200-20 MG/5ML suspension 30 mL  30 mL Oral Q4H PRN Kerrie Buffalo, NP      . amLODipine (NORVASC) tablet 5 mg  5 mg Oral Daily Laverle Hobby, PA-C   5 mg at 10/27/16 0817  . aspirin EC tablet 81 mg  81 mg Oral Daily Laverle Hobby, PA-C   81 mg at 10/27/16 3893  . DULoxetine (CYMBALTA) DR capsule 30 mg  30 mg Oral Daily Myer Peer Cobos, MD      . gabapentin (NEURONTIN) capsule 100 mg  100 mg Oral BID Myer Peer Cobos, MD      . lidocaine (LIDODERM) 5 % 1 patch  1 patch Transdermal Q24H Kerrie Buffalo, NP   1 patch at 10/26/16 1815  . lisinopril (PRINIVIL,ZESTRIL) tablet 20 mg  20 mg Oral Daily Laverle Hobby, PA-C   20 mg at 10/27/16 0817  . magnesium hydroxide (MILK OF MAGNESIA) suspension 30 mL  30 mL Oral Daily PRN Kerrie Buffalo, NP      . nitroGLYCERIN (NITROSTAT) SL tablet 0.4 mg  0.4 mg Sublingual Q5 min PRN Laverle Hobby, PA-C      . pantoprazole (PROTONIX) EC tablet 40 mg  40 mg Oral Daily Laverle Hobby, PA-C   40 mg at 10/27/16 0817  . sucralfate (CARAFATE) tablet 1 g  1 g Oral TID AC & HS Laverle Hobby, PA-C   1 g at 10/27/16 1230  . traZODone (DESYREL) tablet 50 mg  50 mg Oral QHS PRN Kerrie Buffalo, NP   50 mg at 10/26/16 2153    PTA Medications: Prescriptions Prior to Admission  Medication Sig Dispense Refill Last Dose  . amLODipine (NORVASC) 5 MG tablet Take 5 mg by mouth daily.   Past Week at Unknown time  .  Aspirin-Acetaminophen-Caffeine (GOODY HEADACHE PO) Take 1-2 Packages by mouth every 2 (two) hours as needed.   10/24/2016 at Unknown time  . HYDROcodone-acetaminophen (NORCO) 10-325 MG tablet Take 1 tablet by mouth every 6 (six) hours as needed for severe pain.   Past Week at Unknown time  . lisinopril (PRINIVIL,ZESTRIL) 20 MG tablet Take 20 mg by mouth daily.   Past Week at Unknown time  . Multiple Vitamins-Minerals (MULTIVITAMIN MEN 50+) TABS Take 1 tablet by mouth daily.   Past Week at Unknown time  . nitroGLYCERIN (NITROSTAT) 0.4 MG SL tablet Place 0.4 mg under the tongue every 5 (five) minutes as needed for chest pain.   unknown  . omeprazole (PRILOSEC) 40 MG capsule Take 1 capsule (40 mg total) by mouth 2 (two) times daily before a meal. 60 capsule 3 Past Week at Unknown time  . polyethylene glycol-electrolytes (TRILYTE) 420 g solution Take 4,000 mLs by mouth as directed. 4000 mL 0   . sucralfate (CARAFATE) 1 g tablet Take 1 g by mouth 4 (four) times daily -  before meals and at bedtime.   Past Week at Unknown time    Treatment Modalities: Medication Management, Group therapy,  Case management,  1 to 1 session with clinician, Psychoeducation, Recreational therapy.  Patient Stressors: Financial difficulties Occupational concerns Patient Strengths: Active sense of humor Communication skills  Physician Treatment Plan for Primary Diagnosis: Major Depression, Severe, No Psychotic Features  Long Term Goal(s): Improvement in symptoms so as ready for discharge Short Term Goals:    Medication Management: Evaluate patient's response, side effects, and tolerance of medication regimen.  Therapeutic Interventions: 1 to 1 sessions, Unit Group sessions and Medication administration.  Evaluation of Outcomes: Not Met  Physician Treatment Plan for Secondary Diagnosis: Active Problems:   * No active hospital problems. *  Long Term Goal(s): Improvement in symptoms so as ready for discharge  Short  Term Goals: Ability to verbalize feelings will improve Ability to disclose and discuss suicidal ideas Ability to demonstrate self-control will improve Ability to identify and develop effective coping behaviors will improve Ability to maintain clinical measurements within normal limits will improve Compliance with prescribed medications will improve Ability to verbalize feelings will improve Ability to disclose and discuss suicidal ideas Ability to demonstrate self-control will improve Ability to identify and develop effective coping behaviors will improve Ability to maintain clinical measurements within normal limits will improve  Medication Management: Evaluate patient's response, side effects, and tolerance of medication regimen.  Therapeutic Interventions: 1 to 1 sessions, Unit Group sessions and Medication administration.  Evaluation of Outcomes: Not Met  RN Treatment Plan for Primary Diagnosis: Major Depression, Severe, No Psychotic Features   Long Term Goal(s): Knowledge of disease and therapeutic regimen to maintain health will improve  Short Term Goals: Ability to remain free from injury will improve, Ability to verbalize feelings will improve, Ability to disclose and discuss suicidal ideas and Compliance with prescribed medications will improve  Medication Management: RN will administer medications as ordered by provider, will assess and evaluate patient's response and provide education to patient for prescribed medication. RN will report any adverse and/or side effects to prescribing provider.  Therapeutic Interventions: 1 on 1 counseling sessions, Psychoeducation, Medication administration, Evaluate responses to treatment, Monitor vital signs and CBGs as ordered, Perform/monitor CIWA, COWS, AIMS and Fall Risk screenings as ordered, Perform wound care treatments as ordered.  Evaluation of Outcomes: Not Met  LCSW Treatment Plan for Primary Diagnosis: Major Depression, Severe, No  Psychotic Features  Long Term Goal(s): Safe transition to appropriate next level of care at discharge, Engage patient in therapeutic group addressing interpersonal concerns. Short Term Goals: Engage patient in aftercare planning with referrals and resources, Increase ability to appropriately verbalize feelings, Facilitate patient progression through stages of change regarding substance use diagnoses and concerns, Identify triggers associated with mental health/substance abuse issues and Increase skills for wellness and recovery  Therapeutic Interventions: Assess for all discharge needs, 1 to 1 time with Social worker, Explore available resources and support systems, Assess for adequacy in community support network, Educate family and significant other(s) on suicide prevention, Complete Psychosocial Assessment, Interpersonal group therapy.  Evaluation of Outcomes: Not Met  Progress in Treatment: Attending groups: Pt is new to milieu, continuing to assess  Participating in groups: Pt is new to milieu, continuing to assess  Taking medication as prescribed: Yes, MD continues to assess for medication changes as needed Toleration medication: Yes, no side effects reported at this time Family/Significant other contact made: No, pt declines family contact. Patient understands diagnosis: Continuing to assess Discussing patient identified problems/goals with staff: Yes Medical problems stabilized or resolved: Yes Denies suicidal/homicidal ideation:  Issues/concerns per patient self-inventory: None Other: N/A  New problem(s) identified: None identified at this time.   New Short Term/Long Term Goal(s): None identified at this time.   Discharge Plan or Barriers: Pt will return home and follow up outpaient with [facility].  Reason for Continuation of Hospitalization:  Anxiety  Depression Medication stabilization Suicidal ideation  Estimated Length of Stay: 3-5 days  Attendees: Patient: 10/26/2016  2:54 PM  Physician: Dr. Parke Poisson 10/26/2016 2:54 PM  Nursing: Sharl Ma RN; [place], RN 10/26/2016 2:54 PM  RN Care Manager: Lars Pinks, RN 10/26/2016 2:54 PM  Social Worker: Matthew Saras, [place] 10/26/2016 2:54 PM  Recreational Therapist:  10/26/2016 2:54 PM  Other: Lindell Spar, NP; Samuel Jester, NP 10/26/2016 2:54 PM  Other:  10/26/2016 2:54 PM  Other: 10/26/2016 2:54 PM   Scribe for Treatment Team: Georga Kaufmann, MSW,LCSWA 10/26/2016 2:54 PM

## 2016-10-26 NOTE — Tx Team (Signed)
Initial Treatment Plan 10/26/2016 4:52 PM Marcus Sanchez ZOX:[id number]    PATIENT STRESSORS: Financial difficulties Occupational concerns   PATIENT STRENGTHS: Active sense of humor Communication skills   PATIENT IDENTIFIED PROBLEMS: Pt. Reports no problems at this time                     DISCHARGE CRITERIA:  Improved stabilization in mood, thinking, and/or behavior Need for constant or close observation no longer present Reduction of life-threatening or endangering symptoms to within safe limits  PRELIMINARY DISCHARGE PLAN: Attend aftercare/continuing care group Outpatient therapy Placement in alternative living arrangements  PATIENT/FAMILY INVOLVEMENT: This treatment plan has been presented to and reviewed with the patient, Marcus Sanchez.  The patient and family have been given the opportunity to ask questions and make suggestions.  Barton Fanny, RN 10/26/2016, 4:52 PM

## 2016-10-26 NOTE — ED Notes (Signed)
Gave patient ice water as requested.  

## 2016-10-26 NOTE — Progress Notes (Signed)
Patient reports that he has a stroke recently and that he lives alone and now is unable to work; patient reports that he has no support systems; patient reports never being married and no children; patient reports thoughts of SI but contracts for safety; patient has left sided weakness due to the recent stroke; patient uses a can to ambulate; patient reports that he has access to a lot of guns and knives;

## 2016-10-27 DIAGNOSIS — K625 Hemorrhage of anus and rectum: Secondary | ICD-10-CM

## 2016-10-27 DIAGNOSIS — F332 Major depressive disorder, recurrent severe without psychotic features: Secondary | ICD-10-CM

## 2016-10-27 DIAGNOSIS — R197 Diarrhea, unspecified: Secondary | ICD-10-CM

## 2016-10-27 DIAGNOSIS — K289 Gastrojejunal ulcer, unspecified as acute or chronic, without hemorrhage or perforation: Secondary | ICD-10-CM

## 2016-10-27 DIAGNOSIS — Z79899 Other long term (current) drug therapy: Secondary | ICD-10-CM

## 2016-10-27 MED ORDER — DULOXETINE HCL 30 MG PO CPEP
30.0000 mg | ORAL_CAPSULE | Freq: Every day | ORAL | Status: DC
  Administered 2016-10-27 – 2016-10-28 (×2): 30 mg via ORAL
  Filled 2016-10-27 (×4): qty 1

## 2016-10-27 MED ORDER — GABAPENTIN 100 MG PO CAPS
100.0000 mg | ORAL_CAPSULE | Freq: Two times a day (BID) | ORAL | Status: DC
  Administered 2016-10-27 – 2016-10-28 (×2): 100 mg via ORAL
  Filled 2016-10-27 (×4): qty 1

## 2016-10-27 NOTE — BHH Group Notes (Signed)
[facility] Group Notes:  Leisure and Lifestyle Changes  Date:  10/27/2016  Time:  11:00 AM  Type of Therapy:  Psychoeducational Skills  Participation Level:  Minimal  Participation Quality:  Appropriate and Attentive  Affect:  Blunted  Cognitive:  Appropriate  Insight:  Appropriate  Engagement in Group:  Engaged  Modes of Intervention:  Discussion and Education  Summary of Progress/Problems: Patient attended group and was engaged.   Marcus Sanchez E 10/27/2016, 11:00 AM
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
## 2016-10-27 NOTE — Plan of Care (Signed)
Problem: Medication: Goal: Compliance with prescribed medication regimen will improve Outcome: Progressing Pt has been compliant with medication regimen tonight.   

## 2016-10-27 NOTE — [facility] Group Notes (Signed)
[facility] Mental [facility] Group Therapy 10/27/2016 1:15pm  Type of Therapy: Mental Health Association Presentation  Participation Level: Active  Participation Quality: Attentive  Affect: Appropriate  Cognitive: Oriented  Insight: Developing/Improving  Engagement in Therapy: Engaged  Modes of Intervention: Discussion, Education and Socialization  Summary of Progress/Problems: Mental Health Association (MHA) Speaker came to talk about his personal journey with substance abuse and addiction. The pt processed ways by which to relate to the speaker. MHA speaker provided handouts and educational information pertaining to groups and services offered by the [facility]. Pt was engaged in speaker's presentation and was receptive to resources provided.    Vito Backers. Beverely Pace, MSW, LCSWA 10/27/2016 2:09 PM

## 2016-10-27 NOTE — Progress Notes (Signed)
Patient ID: Marcus Sanchez, male   DOB: Mar 13, 1966, 51 y.o.   MRN: [id number]  DAR: Pt. Denies SI/HI and A/V Hallucinations. He reports sleep is fair, appetite is fair, energy level is low, and concentration is good. He rates depression 7/10 and hopelessness 0/10. Patient does report pain throughout the day with no reported relief. He reports, "I won't ask for help again." When speaking 1:1 with patient it was apparent patient was hopeless and helpless. He reports that he is going to lose his truck, his house, he has no money, and his physical health is declining. Support and encouragement provided to the patient however patient is resistant to care. He reports, "I don't want to ask for things." Scheduled medications administered to patient per physician's orders. Patient is seen intermittently in the milieu but does not stay for long. He is utilizing his cane to aide in ambulation. He opted to not attend dinner this evening although writer encouraged that he do so. Q15 minute checks are maintained for safety.

## 2016-10-27 NOTE — Progress Notes (Signed)
D: Pt was in bed in his room upon initial approach.  Pt presents with depressed affect and mood.  His goal is "just trying to get through the day."  Pt denies SI/HI, denies hallucinations, reports chronic back pain of 9/10.  Pt has been visible in milieu interacting with peers and staff appropriately.  Pt attended evening group.  He has been ambulating with his cane tonight.    A: Introduced self to pt.  Actively listened to pt and offered support and encouragement. Medication administered per order.  PRN medication administered for sleep and pain.  Heat packs provided for pain.  Q15 minute safety checks maintained.  R: Pt is safe on the unit.  Pt is compliant with medications.  Pt verbally contracts for safety.  Will continue to monitor and assess.

## 2016-10-27 NOTE — Progress Notes (Signed)
Smith Mince had been in room for much of the evening, did go to evening group activity and spoke about his day a little and spoke about it being ok. Dejour did not spend much time interacting this evening and went back to his room shortly after group. Brion did endorse back pain but chose not to accept any offered medications. Juergen did receive trazodone to help with sleep. He spoke about his situation and how he is unsure about where he is going to go when he gets out of here as he has no place to live and no money. A. Support provided, medication education given. R. Safety maintained, will continue to monitor.

## 2016-10-27 NOTE — Progress Notes (Signed)
[facility] Group Notes:  (Nursing/MHT/Case Management/Adjunct)  Date:  10/27/2016  Time:  10:10 PM  Type of Therapy:  Psychoeducational Skills  Participation Level:  Minimal  Participation Quality:  Inattentive  Affect:  Flat  Cognitive:  Lacking  Insight:  Lacking  Engagement in Group:  Resistant  Modes of Intervention:  Education  Summary of Progress/Problems: The patient verbalized that he had a bad day since their wasn't enough for him to do on the hallway. His goal for tomorrow is to get discharged.   Tanji Storrs S 10/27/2016, 10:10 PM

## 2016-10-28 LAB — TSH: TSH: 2.848 u[IU]/mL (ref 0.350–4.500)

## 2016-10-28 MED ORDER — NAPROXEN 500 MG PO TABS
500.0000 mg | ORAL_TABLET | Freq: Two times a day (BID) | ORAL | Status: DC | PRN
  Administered 2016-10-28 – 2016-10-29 (×3): 500 mg via ORAL
  Filled 2016-10-28 (×3): qty 1

## 2016-10-28 MED ORDER — METHOCARBAMOL 500 MG PO TABS
1000.0000 mg | ORAL_TABLET | Freq: Three times a day (TID) | ORAL | Status: DC | PRN
  Administered 2016-10-28 – 2016-10-30 (×5): 1000 mg via ORAL
  Filled 2016-10-28 (×5): qty 2

## 2016-10-28 MED ORDER — DULOXETINE HCL 20 MG PO CPEP
40.0000 mg | ORAL_CAPSULE | Freq: Every day | ORAL | Status: DC
Start: 2016-10-29 — End: 2016-10-31
  Administered 2016-10-29 – 2016-10-31 (×3): 40 mg via ORAL
  Filled 2016-10-28 (×4): qty 2

## 2016-10-28 MED ORDER — GABAPENTIN 100 MG PO CAPS
100.0000 mg | ORAL_CAPSULE | Freq: Three times a day (TID) | ORAL | Status: DC
  Administered 2016-10-28 – 2016-10-31 (×9): 100 mg via ORAL
  Filled 2016-10-28 (×11): qty 1

## 2016-10-28 MED ORDER — NICOTINE POLACRILEX 2 MG MT GUM: 2.0000 mg | CHEWING_GUM | OROMUCOSAL | Status: DC | PRN
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
## 2016-10-28 NOTE — [facility] Group Notes (Signed)
[facility] LCSW Group Therapy 10/28/2016 1:15pm  Type of Therapy: Group Therapy- Feelings Around Relapse and Recovery  Participation Level: Minimal  Participation Quality:  Attentive  Affect:  Appropriate  Cognitive: Alert and Oriented   Insight:  Developing   Engagement in Therapy: Developing/Improving and Engaged   Modes of Intervention: Clarification, Confrontation, Discussion, Education, Exploration, Limit-setting, Orientation, Problem-solving, Rapport Building, Dance movement psychotherapist, Socialization and Support  Summary of Progress/Problems: The topic for today was feelings about relapse. The group discussed what relapse prevention is to them and identified triggers that they are on the path to relapse. Members also processed their feeling towards relapse and were able to relate to common experiences. Group also discussed coping skills that can be used for relapse prevention.  Pt participated minimally in group discussion but engaged in most of the mindfulness activities completed by the group.    Therapeutic Modalities:   Cognitive Behavioral Therapy Solution-Focused Therapy Assertiveness Training Relapse Prevention Therapy    Damien Fusi [phone] 10/28/2016 2:46 PM

## 2016-10-28 NOTE — Progress Notes (Signed)
Recreation Therapy Notes  Date: 10/28/16 Time: 0930 Location: [address] Hall Dayroom  Group Topic: Stress Management  Goal Area(s) Addresses:  Patient will verbalize importance of using healthy stress management.  Patient will identify positive emotions associated with healthy stress management.   Intervention: Stress Management  Activity :  Meditation.  LRT introduced the stress management technique of meditation.  LRT played a meditation on resilience from the Calm app to allow the patients to engage in the technique.  Patients were to follow along as the meditation played to participate in the technique.   Education:  Stress Management, Discharge Planning.   Education Outcome: Acknowledges edcuation/In group clarification offered/Needs additional education  Clinical Observations/Feedback: Pt did not attend group.   Caroll Rancher, LRT/CTRS         Caroll Rancher A 10/28/2016 12:26 PM

## 2016-10-28 NOTE — Progress Notes (Signed)
Marcus Sanchez has spent much of his day in the dayroom today..he is observed interacting with other patients and watching TV. He has attended his groups and he completed his daily assesment and on it he wrote  He denied SI today and eh rated his depression, hopelessness and anxiety " 3/2/4", respectively. A He takes his scheduled meds as planned and he is " future-oriented " in his conversations, frequently talking about looking forward to returning to work and to having a purpose. He is encouraged by this Clinical research associate to cont to focus on his work , his future and to stay positive. R Safety is in place and poc cont.

## 2016-10-28 NOTE — Plan of Care (Signed)
Problem: Safety: Goal: Periods of time without injury will increase Outcome: Progressing Pt has not harmed self or others tonight.  He denies SI/HI and verbally contracts for safety.  Pt is a high fall risk and he has been ambulating using his cane.

## 2016-10-28 NOTE — Progress Notes (Signed)
D: Pt was in the dayroom upon initial approach.  Pt presents with appropriate affect and pleasant mood.  His goal is to "get out of here."  Pt reports he is discharging "hopefully tomorrow."  Pt reports he feels safe to discharge and he plans to follow up with [facility] for aftercare.  Pt denies SI/HI, denies hallucinations, reports chronic back pain of 8/10.  Pt has been visible in milieu interacting with peers and staff appropriately.  He has been smiling frequently and joking with staff and peers.  Pt attended evening group.    A: Actively listened to pt and offered support and encouragement. Medications administered per order.  PRN medication administered for pain, muscle spasms, and sleep.  Q15 minute safety checks maintained.  R: Pt is safe on the unit.  Pt is compliant with medications.  Pt verbally contracts for safety.  Will continue to monitor and assess.

## 2016-10-29 DIAGNOSIS — Z87891 Personal history of nicotine dependence: Secondary | ICD-10-CM

## 2016-10-29 DIAGNOSIS — K259 Gastric ulcer, unspecified as acute or chronic, without hemorrhage or perforation: Secondary | ICD-10-CM

## 2016-10-29 NOTE — [facility] Group Notes (Signed)
Adult Group Therapy Note (Social Work)  Date:  10/29/2016  Time:  10:00-11:00AM  Group Topic/Focus:  Today's process group focused on the topic of Self Sabotage, what this is, and what methods of self-sabotage patients in the group have found themselves using.  Commonalities were then pointed out and Motivational Interviewing was utilized to explore possible benefits of choosing healthier coping skills.  Participation Level:  Active  Participation Quality:  Attentive and Sharing  Affect:  Blunted and Resistant  Cognitive:  Appropriate  Insight: Improving  Engagement in Group:  Developing/Improving  Modes of Intervention:  Discussion and Motivational Interviewing  Additional Comments:  The patient expressed that he was raised in [place].  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  Nobody has ever been in his house, for instance, and he even has a post office box instead of having mail delivered at this home.  He was very resistant throughout group to the idea that isolation can be detrimental, stating he has isolated his whole life because he likes it that way and does not want to be hurt by people.  Eventually he shared that he has taught a number of people how to drive trucks, and the group pointed out to him how he has benefited other people's lives by choosing not to isolate.  He was able to acknowledge this to be true.  Ambrose Mantle, LCSW 10/29/2016, 1:08 PM

## 2016-10-29 NOTE — Progress Notes (Signed)
Marcus Sanchez is seen sitting in the dayroom this evening..watching TV. He shruggs his shoulders and says " I dont know..I wanted to go home" when this writer asks him how he feels about not being dc'Marcus today. His affect is flat, depressed and unengaged. He shruggs his shoulders and says " I wanted to go home.... I was thinking he was going to discharge me  Today".  A he completes his daily assessment and on this he wrote he  Denied SI  Today and he rated his depression, hopelessness and anxeity " 2/3/0", respectively. Again, pt talks about the importance of his returning to his work...how his work defines him and grounds him and how much he is looking forward to this. He attends his Life SKills group today and demonstrates forward-thinking when he talks about his plans for the future. R Safety in place.
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
## 2016-10-29 NOTE — [facility] Group Notes (Signed)
Identifying Needs   Date:  10/29/2016  Time:  1400  Type of Therapy:  Nurse Education  /  The group focuses on teaching patients how to identify their needs and then how to develop skills needed to get needs met.   Participation Level:  Active  Participation Quality:  Attentive  Affect:  Depressed  Cognitive:  Alert  Insight:  Improving  Engagement in Group:  Engaged  Modes of Intervention:  Education  Summary of Progress/Problems:  Marcus Sanchez 10/29/2016, 5:18 PM

## 2016-10-29 NOTE — Progress Notes (Signed)
Patient attended wrap-up group and said that his day was a 2. He slept most of the day.

## 2016-10-30 DIAGNOSIS — F332 Major depressive disorder, recurrent severe without psychotic features: Secondary | ICD-10-CM

## 2016-10-30 NOTE — Progress Notes (Signed)
D: Pt was in the dayroom upon initial approach.  Pt presents with depressed affect and mood.  When asked about his day, he states "I'm here, I'm doing good I guess."  His goal is "sleeping I guess."  Pt tries to joke with staff and peers at times.  He lacks insight into the fact that his "jokes" can be offensive; example-pt calling staff member a nazi, then laughing and saying "I didn't mean it like that, I know a few nazis."  Limits set with pt related to inappropriate comments.  Pt denies SI/HI, denies hallucinations, reports chronic back pain of 9/10.  Pt attended evening group.    A: Actively listened to pt and offered support and encouragement. Medications administered per order.  PRN medication administered for pain, muscle spasms, and sleep.  Q15 minute safety checks maintained.  R: Pt is safe on the unit.  Pt is compliant with medications.  Pt verbally contracts for safety.  Will continue to monitor and assess.

## 2016-10-30 NOTE — [facility] Counselor (Signed)
Clinical Social Work Note  Met with pt 1:1 at his request.  He wanted to know of resources in his area for assistance with rent and food, stating that he is about to lose everything.  He stated once he had his stroke, he lost his job immediately, without severance pay.  He has been to several churches and the Dept of Social Services, states he was only given $10 in monthly Food Stamps.  When CSW asked what will happen after this month, even if he is able to find the necessary funds for this month, he stated he hopes that his disability will be approved by then.  He was encouraged to make sure that the Social Security Administration gets all copies of all medical records to consider with his application.  He was informed that the process is generally much longer than the 2 months he is thinking.  He talked about ending up living outdoors.  CSW explored whether there is anybody he knows who could help him or provide housing temporarily, and he denied knowing anyone.  CSW got information on 3 food pantries and provided to him, informed his weekday CSW of his concerns.  Mareida Grossman-Orr, LCSW 10/30/2016, 4:49 PM    

## 2016-10-30 NOTE — [facility] Group Notes (Signed)
[facility] Group Notes:  (Nursing/MHT/Case Management/Adjunct)  Date:  10/30/2016  Time:  4:43 PM  Type of Therapy:  Psychoeducational Skills  Participation Level:  Minimal  Participation Quality:  Appropriate  Affect:  Depressed  Cognitive:  Appropriate  Insight:  Limited  Engagement in Group:  Engaged and Resistant  Modes of Intervention:  Discussion and Education  Summary of Progress/Problems: Patient attended this afternoon's life skills group.  Marzetta Board E 10/30/2016, 4:43 PM
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
## 2016-10-30 NOTE — B[facility] Group Notes (Signed)
Adult Therapy Group Note  Date:  10/30/2016  Time:  10:00-11:00AM  Group Topic/Focus: Fears and Healthy/Unhealthy Coping Skills  Building Self Esteem:   The Focus of this group was to discuss some of the prevalent fears that patients experience, and to list some unhealthy coping and healthy coping techniques to deal with each fear, as well as supports that could help in using healthy coping.  This included a variety of supports, and CSW emphasized professional supports such as therapist, support groups and psychiatrist.  Several specific scenarios were reviewed, with suggestions for how to go about actually implementing the healthier coping technique(s).  Additional Comments:  The patient expressed that he does not know of any coping skills that he would like to learn.  Even after everyone else in the room had shared their desired coping skills, he said none of them gave him any ideas of coping skills that would help him.  He participated little in the discussion except when asked a direct question.  Participation Level:  Active  Participation Quality:  Attentive  Affect:  Appropriate  Cognitive:  Appropriate  Insight: Lacking  Engagement in Group:  Limited  Modes of Intervention:  Discussion, Exploration   Ambrose Mantle, LCSW 10/30/2016   12:35pm

## 2016-10-30 NOTE — Progress Notes (Signed)
Patient attended group and said that his day was a 2. He said that there was nothing positive about his day.

## 2016-10-30 NOTE — Progress Notes (Addendum)
Marcus Sanchez remains  quiet, sad and depressed. He is emoting...talking about all that he doesn't have...that he has lost and will never have..he is totally focused on the negatives in his life. A HE completed his daily assessment and on it he wrote  He deneid SI today and he rated his depression, hopelessness and anxiety  " 2/   /2", respectively. He is VERY sad, says " I don't know what Im going to do when I leave here..I'm  going to have nothing". He spoke with SW and this Clinical research associate and is encouraged to follow up with info given to him by SW. R Safety in place.

## 2016-10-30 NOTE — Plan of Care (Signed)
Problem: Activity: Goal: Sleeping patterns will improve Outcome: Not Progressing Pt reports he did not sleep well last night.  He slept 3.5 hours last night according to flowsheet.

## 2016-10-31 MED ORDER — DULOXETINE HCL 40 MG PO CPEP: 40.0000 mg | ORAL_CAPSULE | Freq: Every day | ORAL | 0 refills | Status: DC

## 2016-10-31 MED ORDER — TRAZODONE HCL 50 MG PO TABS
50.0000 mg | ORAL_TABLET | Freq: Every evening | ORAL | 0 refills | Status: DC | PRN
Start: 2016-10-31 — End: 2017-05-23

## 2016-10-31 MED ORDER — LISINOPRIL 20 MG PO TABS: 20.0000 mg | ORAL_TABLET | Freq: Every day | ORAL | 0 refills | Status: DC

## 2016-10-31 MED ORDER — LIDOCAINE 5 % EX PTCH
1.0000 | MEDICATED_PATCH | CUTANEOUS | 0 refills | Status: DC
Start: 2016-10-31 — End: 2017-05-23

## 2016-10-31 MED ORDER — NICOTINE POLACRILEX 2 MG MT GUM: 2.0000 mg | CHEWING_GUM | OROMUCOSAL | 0 refills | Status: DC | PRN

## 2016-10-31 MED ORDER — NITROGLYCERIN 0.4 MG SL SUBL: 0.4000 mg | SUBLINGUAL_TABLET | SUBLINGUAL | 2 refills | Status: DC | PRN

## 2016-10-31 MED ORDER — GABAPENTIN 100 MG PO CAPS: 100.0000 mg | ORAL_CAPSULE | Freq: Three times a day (TID) | ORAL | Status: DC

## 2016-10-31 MED ORDER — AMLODIPINE BESYLATE 5 MG PO TABS: 5.0000 mg | ORAL_TABLET | Freq: Every day | ORAL | 0 refills | Status: DC

## 2016-10-31 NOTE — [facility] Suicide Risk Assessment (Signed)
[facility] Discharge Suicide Risk Assessment   Principal Problem:  MDD  Discharge Diagnoses:  Patient Active Problem List   Diagnosis Date Noted  . Severe episode of recurrent major depressive disorder, without psychotic features (HCC) [F33.2]   . MDD (major depressive disorder) [F32.9] 10/26/2016  . Rectal bleeding [K62.5] 09/27/2016  . Diarrhea [R19.7] 09/27/2016  . 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 rate 18, weight 120.7 kg (266 lb), SpO2 100 %.Body mass index is 37.1 kg/m.  General Appearance: partially improved grooming   Eye Contact::  Good  Speech:  Normal Rate409  Volume:  Normal  Mood:  improved mood compared to admission, states he feels better   Affect:  more reactive, brighter   Thought Process:  Linear and Descriptions of Associations: Intact  Orientation:  Full (Time, Place, and Person)  Thought Content:  no hallucinations, no delusions, not internally preoccupied, future oriented, for example focusing on keeping a disability appointment later this week  Suicidal Thoughts:  No at this time denies any suicidal or self injurious ideations  Homicidal Thoughts:  No denies homicidal or violent ideations  Memory:  recent and remote grossly intact   Judgement:  Other:  improved  Insight:  improved   Psychomotor Activity:  normal, visible on unit   Concentration:  Good  Recall:  Good  Fund of Knowledge:Good  Language: Good  Akathisia:  Negative  Handed:  Right  AIMS (if indicated):     Assets:  Communication Skills Desire for Improvement Resilience  Sleep:  Number of Hours: 4.25   Cognition: WNL  ADL's:  Intact   Mental Status Per Nursing Assessment::   On Admission:  Suicidal ideation indicated by patient, Suicide plan, Self-harm thoughts  Demographic Factors:  51 year old male, lives alone, currently unemployed   Loss Factors: Job loss, financial difficulties , history of CVA  Historical Factors: One prior psychiatric admission as a teenager, has never attempted suicide, not on any  psychiatric medication(s) prior to admission  Risk Reduction Factors:   Positive coping skills or problem solving skills  Continued Clinical Symptoms:  At this time patient is alert, attentive, well related, calm, mood is improved , states he is feeling better, affect is more reactive, less constricted, and smiles, even laughs appropriately at times, no thought disorder, denies any suicidal or self injurious ideations, denies any homicidal or violent ideations, no psychotic symptoms. At this time presents with less hopelessness and is more future oriented, states he has upcoming appointments for disability determination and is also hoping he may be able to find some job he can continue to do.  Denies medication side effects.   Cognitive Features That Contribute To Risk:  No gross cognitive deficits noted upon discharge. Is alert , attentive, and oriented x 3   Suicide Risk:  Mild to moderate   Follow-up Information    [facility] Follow up on 11/04/2016.   Why:  Hospital follow up appointment @8a . Please arrive as early as possible to be sure you are seen. Contact information: [address] [place] [address] [phone]  Plan Of Care/Follow-up recommendations:  Activity:  as tolerated  Diet:  heart healthy Tests:  NA Other:  See below Patient is requesting discharge and at this time there are no current grounds for involuntary commitment  He is leaving unit in good spirits Plans to return home Plans to follow up as above Plans to continue  to see his PCP for medical management as needed  We reviewed safety plan - patient states he has limited social support system, but does state that he has good relationship with PCP, plans to establish outpatient psychiatric management on discharge, and plans to contact hot line, return to ED if any recurrence/  worsening of depression  COBOS, Madaline Guthrie, MD 10/31/2016, 12:25 PM

## 2016-10-31 NOTE — Progress Notes (Signed)
  [facility] Adult Case Management Discharge Plan :  Will you be returning to the same living situation after discharge:  Yes,  pt will be returning home. At discharge, do you have transportation home?: Yes,  taxi voucher provided. Do you have the ability to pay for your medications: Yes,  prescriptions provided.  Release of information consent forms completed and in the chart;  Patient's signature needed at discharge.  Patient to Follow up at: Follow-up Information    [facility] Follow up on 11/04/2016.   Why:  Hospital follow up appointment @8a . Please arrive as early as possible to be sure you are seen. Contact information: [address] [place] [address] [phone]           Next level of care provider has access to [facility] Link:no  Safety Planning and Suicide Prevention discussed: Yes,  with the pt.  Have you used any form of tobacco in the last 30 days? (Cigarettes, Smokeless Tobacco, Cigars, and/or Pipes): Yes  Has patient been referred to the Quitline?: Patient refused referral  Patient has been referred for addiction treatment: Yes  Jonathon Jordan 10/31/2016, 10:22 AM
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
## 2016-10-31 NOTE — Progress Notes (Signed)
DAR Note: Patient seen on day room watching TV. No interaction. Patient is not giving a direct answer to any question rather, he kept saying "I just want to go home. I got social security issues tomorrow but y'all gonna keep me here". When asked about SI, Patient stated "nothing has changed, still here watching TV. I want to go home".  Staff encouraged patient to continue with the treatment plan and verbalize his discharge concern to the Dr. Stann Mainland continue to monitor patient. Patient remains safe on unit.

## 2016-10-31 NOTE — Progress Notes (Signed)
Patient was discharged per order. AVS, SRA, scripts, and transition summary were all reviewed with patient. Pt was given an opportunity to ask questions and verbalized understanding of all discharge paperwork. Belongings were returned, and patient signed for receipt. Patient verbalized readiness for discharge and appeared in no acute distress when escorted to lobby with taxi voucher.

## 2016-10-31 NOTE — Progress Notes (Signed)
Recreation Therapy Notes  Date: 10/31/16 Time: 0930 Location: 300 [address] Dayroom  Group Topic: Stress Management  Goal Area(s) Addresses:  Patient will verbalize importance of using healthy stress management.  Patient will identify positive emotions associated with healthy stress management.   Intervention: Stress Management  Activity :  Guided Visualization.  LRT introduced the stress management technique of guided visualization.  LRT read a script to allow patient to follow along and engage in the activity.  Patients were to follow along at LRT read script to engage in the activity.  Education:  Stress Management, Discharge Planning.   Education Outcome: Acknowledges edcuation/In group clarification offered/Needs additional education  Clinical Observations/Feedback: Pt did not attend group.    Karlita Lichtman, LRT/CTRS         Karissa Meenan A 10/31/2016 12:53 PM 

## 2016-10-31 NOTE — Tx Team (Signed)
Interdisciplinary Treatment and Diagnostic Plan Update 10/31/2016 Time of Session: 9:30am  Marcus Sanchez  MRN: [id number]  Principal Diagnosis: Major Depression, Severe, No Psychotic Features   Secondary Diagnoses: Active Problems:   MDD (major depressive disorder)   Severe episode of recurrent major depressive disorder, without psychotic features (HCC)   Current Medications:  Current Facility-Administered Medications  Medication Dose Route Frequency Provider Last Rate Last Dose  . acetaminophen (TYLENOL) tablet 650 mg  650 mg Oral Q6H PRN Adonis Brook, NP   650 mg at 10/30/16 1127  . alum & mag hydroxide-simeth (MAALOX/MYLANTA) 200-200-20 MG/5ML suspension 30 mL  30 mL Oral Q4H PRN Adonis Brook, NP      . amLODipine (NORVASC) tablet 5 mg  5 mg Oral Daily Kerry Hough, PA-C   5 mg at 10/31/16 6503  . aspirin EC tablet 81 mg  81 mg Oral Daily Kerry Hough, PA-C   81 mg at 10/31/16 5465  . DULoxetine (CYMBALTA) DR capsule 40 mg  40 mg Oral Daily Craige Cotta, MD   40 mg at 10/31/16 0829  . gabapentin (NEURONTIN) capsule 100 mg  100 mg Oral TID Craige Cotta, MD   100 mg at 10/31/16 0829  . lidocaine (LIDODERM) 5 % 1 patch  1 patch Transdermal Q24H Adonis Brook, NP   1 patch at 10/30/16 1711  . lisinopril (PRINIVIL,ZESTRIL) tablet 20 mg  20 mg Oral Daily Kerry Hough, PA-C   20 mg at 10/31/16 6812  . magnesium hydroxide (MILK OF MAGNESIA) suspension 30 mL  30 mL Oral Daily PRN Adonis Brook, NP      . methocarbamol (ROBAXIN) tablet 1,000 mg  1,000 mg Oral Q8H PRN Jackelyn Poling, NP   1,000 mg at 10/30/16 1127  . naproxen (NAPROSYN) tablet 500 mg  500 mg Oral BID PRN Jackelyn Poling, NP   500 mg at 10/29/16 2102  . nicotine polacrilex (NICORETTE) gum 2 mg  2 mg Oral PRN Craige Cotta, MD      . nitroGLYCERIN (NITROSTAT) SL tablet 0.4 mg  0.4 mg Sublingual Q5 min PRN Kerry Hough, PA-C      . pantoprazole (PROTONIX) EC tablet 40 mg  40 mg Oral Daily Kerry Hough, PA-C   40 mg at 10/31/16 7517  . sucralfate (CARAFATE) tablet 1 g  1 g Oral TID AC & HS Kerry Hough, PA-C   1 g at 10/31/16 0017  . traZODone (DESYREL) tablet 50 mg  50 mg Oral QHS PRN Adonis Brook, NP   50 mg at 10/30/16 2101    PTA Medications: Prescriptions Prior to Admission  Medication Sig Dispense Refill Last Dose  . amLODipine (NORVASC) 5 MG tablet Take 5 mg by mouth daily.   Past Week at Unknown time  . Aspirin-Acetaminophen-Caffeine (GOODY HEADACHE PO) Take 1-2 Packages by mouth every 2 (two) hours as needed.   10/24/2016 at Unknown time  . HYDROcodone-acetaminophen (NORCO) 10-325 MG tablet Take 1 tablet by mouth every 6 (six) hours as needed for severe pain.   Past Week at Unknown time  . lisinopril (PRINIVIL,ZESTRIL) 20 MG tablet Take 20 mg by mouth daily.   Past Week at Unknown time  . Multiple Vitamins-Minerals (MULTIVITAMIN MEN 50+) TABS Take 1 tablet by mouth daily.   Past Week at Unknown time  . nitroGLYCERIN (NITROSTAT) 0.4 MG SL tablet Place 0.4 mg under the tongue every 5 (five) minutes as needed for chest pain.   unknown  .  omeprazole (PRILOSEC) 40 MG capsule Take 1 capsule (40 mg total) by mouth 2 (two) times daily before a meal. 60 capsule 3 Past Week at Unknown time  . polyethylene glycol-electrolytes (TRILYTE) 420 g solution Take 4,000 mLs by mouth as directed. 4000 mL 0   . sucralfate (CARAFATE) 1 g tablet Take 1 g by mouth 4 (four) times daily -  before meals and at bedtime.   Past Week at Unknown time    Treatment Modalities: Medication Management, Group therapy, Case management,  1 to 1 session with clinician, Psychoeducation, Recreational therapy.  Patient Stressors: Financial difficulties Occupational concerns Patient Strengths: Active sense of humor Communication skills  Physician Treatment Plan for Primary Diagnosis: Major Depression, Severe, No Psychotic Features  Long Term Goal(s): Improvement in symptoms so as ready for discharge Short Term  Goals: Ability to verbalize feelings will improve Ability to disclose and discuss suicidal ideas Ability to demonstrate self-control will improve Ability to identify and develop effective coping behaviors will improve Ability to maintain clinical measurements within normal limits will improve Compliance with prescribed medications will improve Ability to verbalize feelings will improve Ability to disclose and discuss suicidal ideas Ability to demonstrate self-control will improve Ability to identify and develop effective coping behaviors will improve Ability to maintain clinical measurements within normal limits will improve  Medication Management: Evaluate patient's response, side effects, and tolerance of medication regimen.  Therapeutic Interventions: 1 to 1 sessions, Unit Group sessions and Medication administration.  Evaluation of Outcomes: Adequate for Discharge  Physician Treatment Plan for Secondary Diagnosis: Active Problems:   MDD (major depressive disorder)   Severe episode of recurrent major depressive disorder, without psychotic features (HCC)  Long Term Goal(s): Improvement in symptoms so as ready for discharge  Short Term Goals: Ability to verbalize feelings will improve Ability to disclose and discuss suicidal ideas Ability to demonstrate self-control will improve Ability to identify and develop effective coping behaviors will improve Ability to maintain clinical measurements within normal limits will improve Compliance with prescribed medications will improve Ability to verbalize feelings will improve Ability to disclose and discuss suicidal ideas Ability to demonstrate self-control will improve Ability to identify and develop effective coping behaviors will improve Ability to maintain clinical measurements within normal limits will improve  Medication Management: Evaluate patient's response, side effects, and tolerance of medication regimen.  Therapeutic  Interventions: 1 to 1 sessions, Unit Group sessions and Medication administration.  Evaluation of Outcomes: Adequate for Discharge  RN Treatment Plan for Primary Diagnosis: Major Depression, Severe, No Psychotic Features   Long Term Goal(s): Knowledge of disease and therapeutic regimen to maintain health will improve  Short Term Goals: Ability to remain free from injury will improve, Ability to verbalize feelings will improve, Ability to disclose and discuss suicidal ideas and Compliance with prescribed medications will improve  Medication Management: RN will administer medications as ordered by provider, will assess and evaluate patient's response and provide education to patient for prescribed medication. RN will report any adverse and/or side effects to prescribing provider.  Therapeutic Interventions: 1 on 1 counseling sessions, Psychoeducation, Medication administration, Evaluate responses to treatment, Monitor vital signs and CBGs as ordered, Perform/monitor CIWA, COWS, AIMS and Fall Risk screenings as ordered, Perform wound care treatments as ordered.  Evaluation of Outcomes: Adequate for Discharge  LCSW Treatment Plan for Primary Diagnosis: Major Depression, Severe, No Psychotic Features  Long Term Goal(s): Safe transition to appropriate next level of care at discharge, Engage patient in therapeutic group addressing interpersonal concerns. Short Term  Goals: Engage patient in aftercare planning with referrals and resources, Increase ability to appropriately verbalize feelings, Facilitate patient progression through stages of change regarding substance use diagnoses and concerns, Identify triggers associated with mental health/substance abuse issues and Increase skills for wellness and recovery  Therapeutic Interventions: Assess for all discharge needs, 1 to 1 time with Social worker, Explore available resources and support systems, Assess for adequacy in community support network, Educate  family and significant other(s) on suicide prevention, Complete Psychosocial Assessment, Interpersonal group therapy.  Evaluation of Outcomes: Adequate for Discharge  Progress in Treatment: Attending groups: Yes Participating in groups: Yes Taking medication as prescribed: Yes, MD continues to assess for medication changes as needed Toleration medication: Yes, no side effects reported at this time Family/Significant other contact made: No, pt declines family contact. Patient understands diagnosis: Yes, AEB willingness to participate in treatment. Discussing patient identified problems/goals with staff: Yes Medical problems stabilized or resolved: Yes Denies suicidal/homicidal ideation: Yes Issues/concerns per patient self-inventory: None Other: N/A  New problem(s) identified: None identified at this time.   New Short Term/Long Term Goal(s): None identified at this time.   Discharge Plan or Barriers: Pt will return home and follow up outpaient with [facility].  Reason for Continuation of Hospitalization:  None identified at this time.  Estimated Length of Stay: 0 days  Attendees: Patient: 10/31/2016 10:17 AM  Physician: Dr. Jama Flavors 10/31/2016 10:17 AM  Nursing: Foy Guadalajara RN; Clydie Braun, RN 10/31/2016 10:17 AM  RN Care Manager: Onnie Boer, RN 10/31/2016 10:17 AM  Social Worker: Donnelly Stager, LCSWA; Vernie Shanks, LCSW 10/31/2016 10:17 AM  Recreational Therapist:  10/31/2016 10:17 AM  Other: Armandina Stammer, NP; Gray Bernhardt, NP 10/31/2016 10:17 AM  Other:  10/31/2016 10:17 AM  Other: 10/31/2016 10:17 AM   Scribe for Treatment Team: Jonathon Jordan, MSW,LCSWA 10/31/2016 10:17 AM

## 2016-11-01 NOTE — Telephone Encounter (Signed)
Called pt to f/u on referral to [facility]. He hadn't called them since he got out of the hospital. Gave him the phone number and he said he will call their office.

## 2016-11-03 ENCOUNTER — Encounter: Payer: Self-pay | Admitting: Gastroenterology

## 2016-11-03 ENCOUNTER — Telehealth: Payer: Self-pay | Admitting: Gastroenterology

## 2016-11-03 ENCOUNTER — Ambulatory Visit: Payer: BLUE CROSS/BLUE SHIELD | Admitting: Gastroenterology

## 2016-11-03 NOTE — Telephone Encounter (Signed)
PATIENT WAS A NO SHOW AND LETTER SENT  °

## 2016-11-08 NOTE — Telephone Encounter (Signed)
Called pt to see if he had an appt at [facility]. He said he had called and is waiting for them to call him back.  He is aware that he missed appt at our office last week. He said he was in the hospital.

## 2016-11-23 ENCOUNTER — Ambulatory Visit: Payer: BLUE CROSS/BLUE SHIELD | Admitting: Neurology

## 2016-12-13 NOTE — Telephone Encounter (Signed)
Referral canceled °

## 2016-12-13 NOTE — Telephone Encounter (Signed)
[facility] to f/u on referral that was sent 09/29/16. Pt has not made an appt with them.  Marcus Sanchez, may we cancel referral for now d/t pt isn't proceeding with referral at this time. I have made phone notes several times that I've contacted him about scheduling appt.

## 2016-12-13 NOTE — Telephone Encounter (Signed)
Yes. He needs to be compliant with follow-up. When we see him again, can refer if necessary.
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
## 2017-05-23 ENCOUNTER — Emergency Department (HOSPITAL_COMMUNITY)
Admission: EM | Admit: 2017-05-23 | Discharge: 2017-05-23 | Disposition: A | Discharge status: Home / Self Care | Payer: BLUE CROSS/BLUE SHIELD | Attending: Emergency Medicine | Admitting: Emergency Medicine

## 2017-05-23 ENCOUNTER — Emergency Department (HOSPITAL_COMMUNITY): Payer: BLUE CROSS/BLUE SHIELD

## 2017-05-23 ENCOUNTER — Encounter (HOSPITAL_COMMUNITY): Payer: Self-pay | Admitting: Emergency Medicine

## 2017-05-23 DIAGNOSIS — Z79899 Other long term (current) drug therapy: Secondary | ICD-10-CM | POA: Diagnosis not present

## 2017-05-23 DIAGNOSIS — I10 Essential (primary) hypertension: Secondary | ICD-10-CM | POA: Insufficient documentation

## 2017-05-23 DIAGNOSIS — Z87891 Personal history of nicotine dependence: Secondary | ICD-10-CM | POA: Diagnosis not present

## 2017-05-23 DIAGNOSIS — R070 Pain in throat: Secondary | ICD-10-CM

## 2017-05-23 DIAGNOSIS — R0789 Other chest pain: Secondary | ICD-10-CM | POA: Insufficient documentation

## 2017-05-23 DIAGNOSIS — R079 Chest pain, unspecified: Secondary | ICD-10-CM | POA: Diagnosis present

## 2017-05-23 LAB — BASIC METABOLIC PANEL
Anion gap: 13 (ref 5–15)
BUN: 10 mg/dL (ref 6–20)
CHLORIDE: 107 mmol/L (ref 101–111)
CO2: 20 mmol/L — ABNORMAL LOW (ref 22–32)
Calcium: 9.2 mg/dL (ref 8.9–10.3)
Creatinine, Ser: 0.94 mg/dL (ref 0.61–1.24)
GFR calc Af Amer: >60 mL/min (ref >60)
GFR calc non Af Amer: >60 mL/min (ref >60)
GLUCOSE: 96 mg/dL (ref 65–99)
POTASSIUM: 3.8 mmol/L (ref 3.5–5.1)
Sodium: 140 mmol/L (ref 135–145)

## 2017-05-23 LAB — CBC
HCT: 43.6 % (ref 39.0–52.0)
Hemoglobin: 14.9 g/dL (ref 13.0–17.0)
MCH: 29 pg (ref 26.0–34.0)
MCHC: 34.2 g/dL (ref 30.0–36.0)
MCV: 85 fL (ref 78.0–100.0)
PLATELETS: 242 10*3/uL (ref 150–400)
RBC: 5.13 MIL/uL (ref 4.22–5.81)
RDW: 13.2 % (ref 11.5–15.5)
WBC: 6.3 10*3/uL (ref 4.0–10.5)

## 2017-05-23 LAB — BRAIN NATRIURETIC PEPTIDE: B Natriuretic Peptide: 27 pg/mL (ref 0.0–100.0)

## 2017-05-23 LAB — D-DIMER, QUANTITATIVE: D-Dimer, Quant: <0.27 ug/mL-FEU (ref 0.00–0.50)

## 2017-05-23 LAB — TROPONIN I

## 2017-05-23 NOTE — ED Triage Notes (Signed)
Pt states 4 months ago was treated for strep throat, continues to have sore throat and pain in upper chest, pt has cardiac history. Pt states he gets very SOB with walking or any activity,
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
## 2017-05-23 NOTE — Discharge Instructions (Signed)
Vital signs within normal limits. Your oxygen level is 98% on room air. Within normal limits by my interpretation. Your cardiac enzymes are negative. Your test for heart failure and blood clot are negative. Your electrolytes are normal. Please call Dr. Wyline Mood and set up an appointment for cardiology evaluation concerning your chest heaviness and easy fatigue and shortness of breath. Please return to the emergency department sooner if any changes in your condition, problems, or concerns. Please use an 81 mg aspirin daily until seen by Dr. Wyline Mood.

## 2017-05-24 ENCOUNTER — Encounter: Payer: Self-pay | Admitting: General Practice

## 2017-05-24 ENCOUNTER — Encounter: Payer: Self-pay | Admitting: Family Medicine

## 2017-05-24 ENCOUNTER — Ambulatory Visit (INDEPENDENT_AMBULATORY_CARE_PROVIDER_SITE_OTHER): Payer: BLUE CROSS/BLUE SHIELD | Admitting: Family Medicine

## 2017-05-24 VITALS — BP 130/80 | HR 111 | Temp 98.6°F | Resp 16 | Ht 71.0 in | Wt 280.0 lb

## 2017-05-24 DIAGNOSIS — I252 Old myocardial infarction: Secondary | ICD-10-CM

## 2017-05-24 DIAGNOSIS — R042 Hemoptysis: Secondary | ICD-10-CM

## 2017-05-24 DIAGNOSIS — F450 Somatization disorder: Secondary | ICD-10-CM | POA: Diagnosis not present

## 2017-05-24 DIAGNOSIS — I10 Essential (primary) hypertension: Secondary | ICD-10-CM | POA: Diagnosis not present

## 2017-05-24 DIAGNOSIS — R0602 Shortness of breath: Secondary | ICD-10-CM | POA: Diagnosis not present

## 2017-05-24 DIAGNOSIS — R070 Pain in throat: Secondary | ICD-10-CM

## 2017-05-24 MED ORDER — LISINOPRIL 20 MG PO TABS: 20.0000 mg | ORAL_TABLET | Freq: Every day | ORAL | 0 refills | Status: AC

## 2017-05-24 MED ORDER — NITROGLYCERIN 0.4 MG SL SUBL: 0.4000 mg | SUBLINGUAL_TABLET | SUBLINGUAL | 2 refills | Status: AC | PRN

## 2017-05-24 MED ORDER — AMLODIPINE BESYLATE 5 MG PO TABS: 5.0000 mg | ORAL_TABLET | Freq: Every day | ORAL | 0 refills | Status: AC

## 2017-05-24 MED ORDER — ALBUTEROL SULFATE HFA 108 (90 BASE) MCG/ACT IN AERS: 1.0000 | INHALATION_SPRAY | RESPIRATORY_TRACT | 1 refills | Status: AC | PRN
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
## 2017-06-08 ENCOUNTER — Encounter: Payer: Self-pay | Admitting: Cardiovascular Disease

## 2017-06-08 ENCOUNTER — Ambulatory Visit (INDEPENDENT_AMBULATORY_CARE_PROVIDER_SITE_OTHER): Payer: BLUE CROSS/BLUE SHIELD | Admitting: Cardiovascular Disease

## 2017-06-08 VITALS — BP 126/82 | HR 80 | Ht 71.0 in | Wt 281.0 lb

## 2017-06-08 DIAGNOSIS — R079 Chest pain, unspecified: Secondary | ICD-10-CM | POA: Diagnosis not present

## 2017-06-08 DIAGNOSIS — R0609 Other forms of dyspnea: Secondary | ICD-10-CM | POA: Diagnosis not present

## 2017-06-08 NOTE — Patient Instructions (Signed)
Medication Instructions:  Your physician recommends that you continue on your current medications as directed. Please refer to the Current Medication list given to you today.   Labwork: none  Testing/Procedures: Your physician has requested that you have a lexiscan myoview. For further information please visit [email or web address]. Please follow instruction sheet, as given.  Your physician has requested that you have an echocardiogram. Echocardiography is a painless test that uses sound waves to create images of your heart. It provides your doctor with information about the size and shape of your heart and how well your heart's chambers and valves are working. This procedure takes approximately one hour. There are no restrictions for this procedure.    Follow-Up: Your physician recommends that you schedule a follow-up appointment in: as needed    Any Other Special Instructions Will Be Listed Below (If Applicable).     If you need a refill on your cardiac medications before your next appointment, please call your pharmacy.   

## 2017-06-22 ENCOUNTER — Ambulatory Visit (HOSPITAL_BASED_OUTPATIENT_CLINIC_OR_DEPARTMENT_OTHER)
Admission: RE | Admit: 2017-06-22 | Discharge: 2017-06-22 | Disposition: A | Discharge status: Home / Self Care | Payer: BLUE CROSS/BLUE SHIELD | Source: Ambulatory Visit | Attending: Cardiovascular Disease | Admitting: Cardiovascular Disease

## 2017-06-22 ENCOUNTER — Encounter (HOSPITAL_BASED_OUTPATIENT_CLINIC_OR_DEPARTMENT_OTHER)
Admission: RE | Admit: 2017-06-22 | Discharge: 2017-06-22 | Disposition: A | Discharge status: Home / Self Care | Payer: BLUE CROSS/BLUE SHIELD | Source: Ambulatory Visit | Attending: Cardiovascular Disease | Admitting: Cardiovascular Disease

## 2017-06-22 ENCOUNTER — Encounter (HOSPITAL_COMMUNITY)
Admission: RE | Admit: 2017-06-22 | Discharge: 2017-06-22 | Disposition: A | Discharge status: Home / Self Care | Payer: BLUE CROSS/BLUE SHIELD | Source: Ambulatory Visit | Attending: Cardiovascular Disease | Admitting: Cardiovascular Disease

## 2017-06-22 DIAGNOSIS — I10 Essential (primary) hypertension: Secondary | ICD-10-CM

## 2017-06-22 DIAGNOSIS — R0609 Other forms of dyspnea: Secondary | ICD-10-CM

## 2017-06-22 DIAGNOSIS — Z8673 Personal history of transient ischemic attack (TIA), and cerebral infarction without residual deficits: Secondary | ICD-10-CM | POA: Insufficient documentation

## 2017-06-22 DIAGNOSIS — R079 Chest pain, unspecified: Secondary | ICD-10-CM | POA: Insufficient documentation

## 2017-06-22 DIAGNOSIS — F329 Major depressive disorder, single episode, unspecified: Secondary | ICD-10-CM

## 2017-06-22 DIAGNOSIS — I252 Old myocardial infarction: Secondary | ICD-10-CM | POA: Insufficient documentation

## 2017-06-22 DIAGNOSIS — Z87891 Personal history of nicotine dependence: Secondary | ICD-10-CM | POA: Insufficient documentation

## 2017-06-22 DIAGNOSIS — I351 Nonrheumatic aortic (valve) insufficiency: Secondary | ICD-10-CM | POA: Insufficient documentation

## 2017-06-22 MED ORDER — REGADENOSON 0.4 MG/5ML IV SOLN
INTRAVENOUS | Status: AC
  Administered 2017-06-22: 0.4 mg via INTRAVENOUS
  Filled 2017-06-22: qty 5

## 2017-06-22 MED ORDER — SODIUM CHLORIDE 0.9% FLUSH
INTRAVENOUS | Status: AC
  Administered 2017-06-22: 10 mL via INTRAVENOUS
  Filled 2017-06-22: qty 10

## 2017-06-22 MED ORDER — TECHNETIUM TC 99M TETROFOSMIN IV KIT
30.0000 | PACK | Freq: Once | INTRAVENOUS | Status: AC | PRN
  Administered 2017-06-22: 33 via INTRAVENOUS

## 2017-06-22 NOTE — Progress Notes (Signed)
*  PRELIMINARY RESULTS* Echocardiogram 2D Echocardiogram has been performed.  Marcus Sanchez 06/22/2017, 11:57 AM

## 2017-06-23 ENCOUNTER — Encounter (HOSPITAL_COMMUNITY)
Admission: RE | Admit: 2017-06-23 | Discharge: 2017-06-23 | Disposition: A | Discharge status: Home / Self Care | Payer: BLUE CROSS/BLUE SHIELD | Source: Ambulatory Visit | Attending: Cardiovascular Disease | Admitting: Cardiovascular Disease

## 2017-06-23 DIAGNOSIS — R079 Chest pain, unspecified: Secondary | ICD-10-CM | POA: Diagnosis not present

## 2017-06-23 LAB — NM MYOCAR MULTI W/SPECT W/WALL MOTION / EF
CHL CUP NUCLEAR SDS: 2
CHL CUP NUCLEAR SRS: 0
CHL CUP NUCLEAR SSS: 2
CHL CUP RESTING HR STRESS: 75 {beats}/min
CSEPPHR: 93 {beats}/min
LHR: 0.31
LV sys vol: 38 mL
LVDIAVOL: 90 mL (ref 62–150)
TID: 0.77

## 2017-06-23 MED ORDER — TECHNETIUM TC 99M TETROFOSMIN IV KIT
25.0000 | PACK | Freq: Once | INTRAVENOUS | Status: AC | PRN
  Administered 2017-06-23: 25 via INTRAVENOUS

## 2017-08-04 ENCOUNTER — Other Ambulatory Visit: Payer: Self-pay | Admitting: Neurology

## 2017-08-04 DIAGNOSIS — G8192 Hemiplegia, unspecified affecting left dominant side: Secondary | ICD-10-CM

## 2017-08-04 DIAGNOSIS — G35 Multiple sclerosis: Secondary | ICD-10-CM

## 2017-08-04 DIAGNOSIS — G253 Myoclonus: Secondary | ICD-10-CM

## 2017-09-11 ENCOUNTER — Ambulatory Visit (HOSPITAL_COMMUNITY): Admission: RE | Admit: 2017-09-11 | Payer: BLUE CROSS/BLUE SHIELD | Source: Ambulatory Visit

## 2017-09-11 ENCOUNTER — Ambulatory Visit (HOSPITAL_COMMUNITY): Payer: BLUE CROSS/BLUE SHIELD

## 2017-11-07 ENCOUNTER — Encounter (HOSPITAL_COMMUNITY): Payer: Self-pay

## 2017-11-07 ENCOUNTER — Other Ambulatory Visit (HOSPITAL_COMMUNITY): Payer: Self-pay

## 2017-11-07 ENCOUNTER — Ambulatory Visit (HOSPITAL_COMMUNITY): Payer: BLUE CROSS/BLUE SHIELD

## 2018-10-16 IMAGING — DX DG CHEST 2V
2 series · 2 of 2 positions shown · non-contrast
Comparison: 10/07/2016

CLINICAL DATA: Chest pain, shortness of Breath

EXAM:
CHEST  2 VIEW

[chest pa]
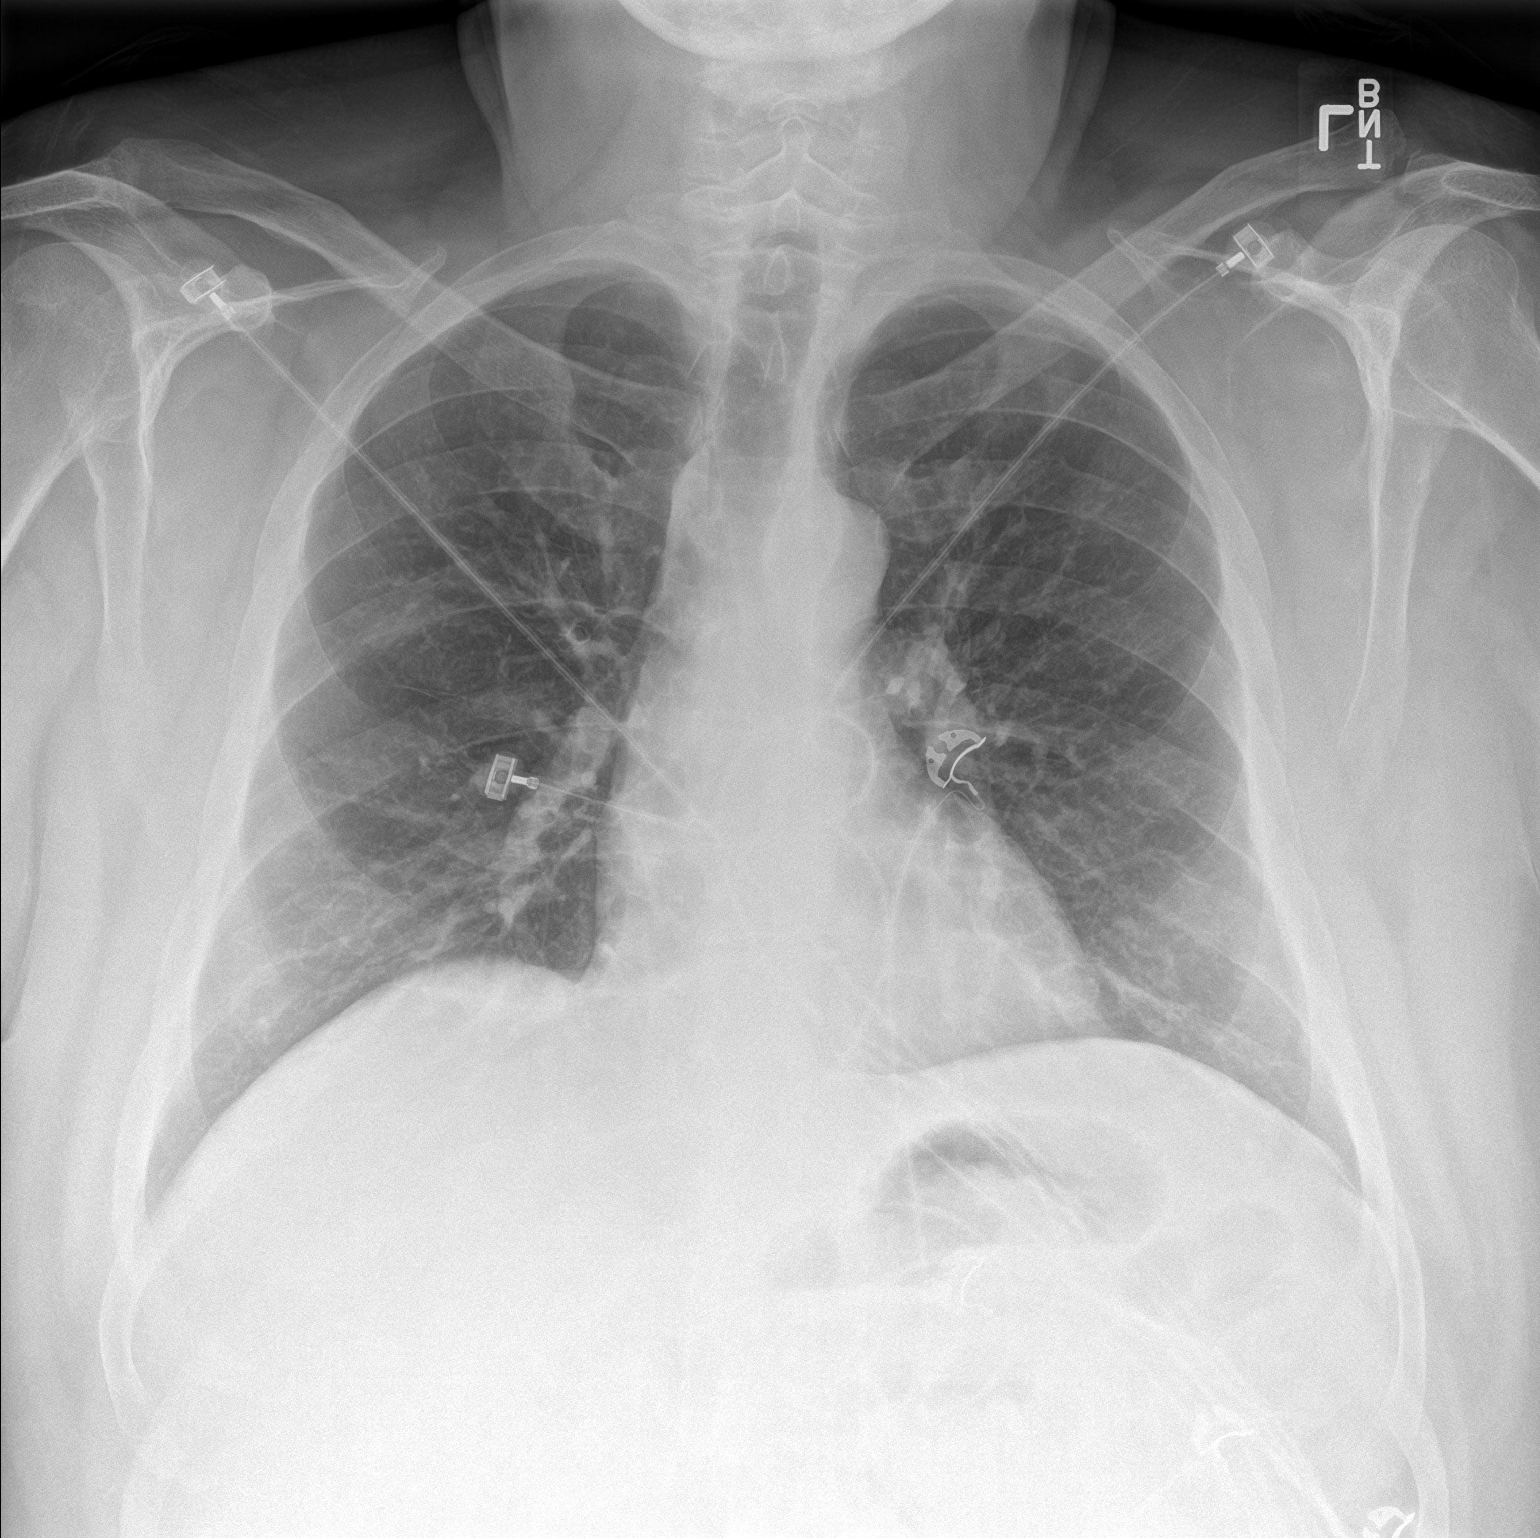

[chest lat]
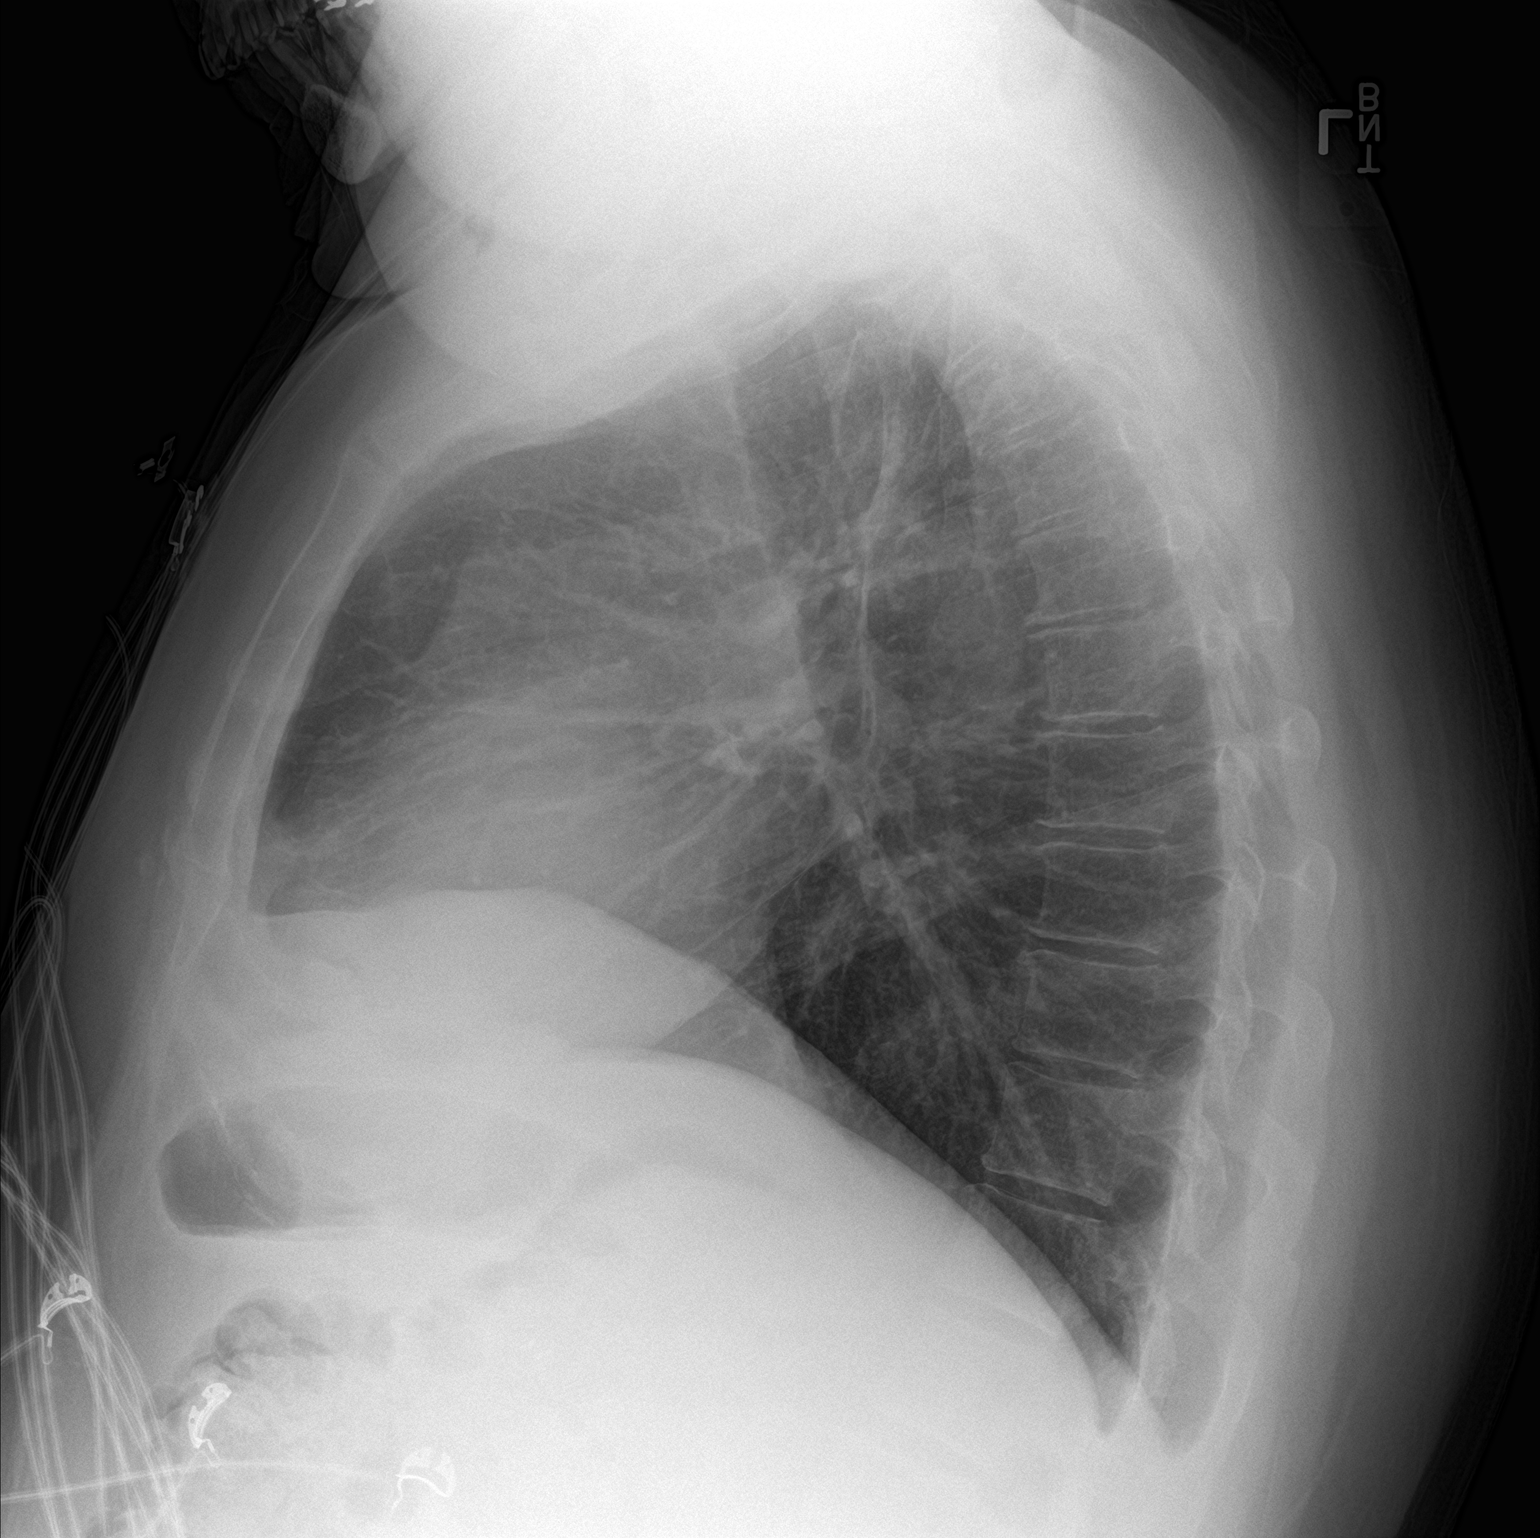

[2 of 2 positions shown; findings below may reference images not displayed]

FINDINGS: Heart and mediastinal contours are within normal limits. No focal
opacities or effusions. No acute bony abnormality.
IMPRESSION: No active cardiopulmonary disease.
